# Patient Record
Sex: Female | Born: 1960 | Race: Black or African American | Hispanic: No | Marital: Married | Smoking: Never smoker
Health system: Southern US, Community
[De-identification: ages and names within clinical notes are randomized; demographics above are authoritative.]

## PROBLEM LIST (undated history)

## (undated) DIAGNOSIS — B019 Varicella without complication: Secondary | ICD-10-CM

## (undated) DIAGNOSIS — I499 Cardiac arrhythmia, unspecified: Secondary | ICD-10-CM

## (undated) DIAGNOSIS — M1990 Unspecified osteoarthritis, unspecified site: Secondary | ICD-10-CM

## (undated) DIAGNOSIS — K219 Gastro-esophageal reflux disease without esophagitis: Secondary | ICD-10-CM

## (undated) DIAGNOSIS — N390 Urinary tract infection, site not specified: Secondary | ICD-10-CM

## (undated) DIAGNOSIS — I38 Endocarditis, valve unspecified: Secondary | ICD-10-CM

## (undated) HISTORY — DX: Endocarditis, valve unspecified: I38

## (undated) HISTORY — DX: Gastro-esophageal reflux disease without esophagitis: K21.9

## (undated) HISTORY — DX: Cardiac arrhythmia, unspecified: I49.9

## (undated) HISTORY — DX: Varicella without complication: B01.9

## (undated) HISTORY — DX: Unspecified osteoarthritis, unspecified site: M19.90

## (undated) HISTORY — PX: ABDOMINAL HYSTERECTOMY: SHX81

## (undated) HISTORY — DX: Urinary tract infection, site not specified: N39.0

---

## 1998-03-22 ENCOUNTER — Ambulatory Visit (HOSPITAL_COMMUNITY): Admission: RE | Admit: 1998-03-22 | Discharge: 1998-03-22 | Payer: Self-pay | Admitting: Obstetrics

## 1998-04-12 ENCOUNTER — Encounter: Admission: RE | Admit: 1998-04-12 | Discharge: 1998-04-12 | Payer: Self-pay | Admitting: Obstetrics

## 1999-06-18 ENCOUNTER — Encounter: Payer: Self-pay | Admitting: Obstetrics

## 1999-06-18 ENCOUNTER — Ambulatory Visit (HOSPITAL_COMMUNITY): Admission: RE | Admit: 1999-06-18 | Discharge: 1999-06-18 | Payer: Self-pay | Admitting: Obstetrics

## 2002-06-01 ENCOUNTER — Ambulatory Visit (HOSPITAL_COMMUNITY): Admission: RE | Admit: 2002-06-01 | Discharge: 2002-06-01 | Payer: Self-pay | Admitting: *Deleted

## 2002-06-01 ENCOUNTER — Encounter: Payer: Self-pay | Admitting: *Deleted

## 2004-05-28 ENCOUNTER — Ambulatory Visit: Payer: Self-pay | Admitting: Obstetrics and Gynecology

## 2004-09-09 ENCOUNTER — Ambulatory Visit: Payer: Self-pay | Admitting: Obstetrics and Gynecology

## 2005-04-02 ENCOUNTER — Ambulatory Visit: Payer: Self-pay | Admitting: Unknown Physician Specialty

## 2005-08-20 ENCOUNTER — Ambulatory Visit: Payer: Self-pay | Admitting: Obstetrics and Gynecology

## 2005-09-02 ENCOUNTER — Ambulatory Visit: Payer: Self-pay | Admitting: Obstetrics and Gynecology

## 2006-03-03 ENCOUNTER — Emergency Department: Payer: Self-pay | Admitting: Emergency Medicine

## 2006-03-03 ENCOUNTER — Other Ambulatory Visit: Payer: Self-pay

## 2006-11-17 ENCOUNTER — Ambulatory Visit: Payer: Self-pay | Admitting: Obstetrics and Gynecology

## 2007-03-17 ENCOUNTER — Ambulatory Visit: Payer: Self-pay | Admitting: Gastroenterology

## 2007-11-30 ENCOUNTER — Ambulatory Visit: Payer: Self-pay | Admitting: Obstetrics and Gynecology

## 2008-12-13 ENCOUNTER — Ambulatory Visit: Payer: Self-pay | Admitting: Obstetrics and Gynecology

## 2009-01-20 ENCOUNTER — Emergency Department: Payer: Self-pay | Admitting: Emergency Medicine

## 2009-07-25 ENCOUNTER — Ambulatory Visit: Payer: Self-pay | Admitting: Gastroenterology

## 2009-12-17 ENCOUNTER — Ambulatory Visit: Payer: Self-pay | Admitting: Obstetrics and Gynecology

## 2010-05-06 ENCOUNTER — Emergency Department: Payer: Self-pay | Admitting: Emergency Medicine

## 2010-07-01 ENCOUNTER — Ambulatory Visit: Payer: Self-pay | Admitting: Gastroenterology

## 2010-07-04 ENCOUNTER — Ambulatory Visit: Payer: Self-pay | Admitting: Specialist

## 2010-07-26 ENCOUNTER — Ambulatory Visit (HOSPITAL_COMMUNITY)
Admission: RE | Admit: 2010-07-26 | Discharge: 2010-07-26 | Payer: Self-pay | Source: Home / Self Care | Attending: Rheumatology | Admitting: Rheumatology

## 2010-09-04 ENCOUNTER — Encounter: Payer: Self-pay | Admitting: Interventional Radiology

## 2010-10-09 ENCOUNTER — Ambulatory Visit: Payer: Self-pay | Admitting: Family Medicine

## 2011-01-15 ENCOUNTER — Ambulatory Visit: Payer: Self-pay | Admitting: Specialist

## 2012-01-09 ENCOUNTER — Ambulatory Visit: Payer: Self-pay | Admitting: Obstetrics and Gynecology

## 2012-02-18 DIAGNOSIS — N390 Urinary tract infection, site not specified: Secondary | ICD-10-CM

## 2012-02-18 HISTORY — DX: Urinary tract infection, site not specified: N39.0

## 2013-01-12 ENCOUNTER — Ambulatory Visit: Payer: Self-pay | Admitting: Obstetrics and Gynecology

## 2013-02-02 LAB — HM COLONOSCOPY: HM Colonoscopy: NORMAL

## 2013-02-23 ENCOUNTER — Ambulatory Visit: Payer: Self-pay | Admitting: Otolaryngology

## 2013-08-04 HISTORY — PX: ESOPHAGOGASTRODUODENOSCOPY ENDOSCOPY: SHX5814

## 2014-01-13 ENCOUNTER — Ambulatory Visit: Payer: Self-pay | Admitting: Obstetrics and Gynecology

## 2014-02-01 HISTORY — PX: COLONOSCOPY: SHX174

## 2014-02-20 ENCOUNTER — Ambulatory Visit: Payer: Self-pay | Admitting: Gastroenterology

## 2014-02-22 LAB — PATHOLOGY REPORT

## 2014-03-29 LAB — HM MAMMOGRAPHY: HM Mammogram: NORMAL

## 2014-03-29 LAB — HM PAP SMEAR: HM Pap smear: NORMAL

## 2014-07-06 ENCOUNTER — Encounter: Payer: Self-pay | Admitting: Internal Medicine

## 2014-07-06 ENCOUNTER — Encounter (INDEPENDENT_AMBULATORY_CARE_PROVIDER_SITE_OTHER): Payer: Self-pay

## 2014-07-06 ENCOUNTER — Ambulatory Visit (INDEPENDENT_AMBULATORY_CARE_PROVIDER_SITE_OTHER): Payer: 59 | Admitting: Internal Medicine

## 2014-07-06 VITALS — BP 102/54 | HR 83 | Temp 98.4°F | Ht 65.66 in | Wt 143.0 lb

## 2014-07-06 DIAGNOSIS — R0781 Pleurodynia: Secondary | ICD-10-CM | POA: Diagnosis not present

## 2014-07-06 DIAGNOSIS — M7521 Bicipital tendinitis, right shoulder: Secondary | ICD-10-CM | POA: Diagnosis not present

## 2014-07-06 DIAGNOSIS — K219 Gastro-esophageal reflux disease without esophagitis: Secondary | ICD-10-CM | POA: Insufficient documentation

## 2014-07-06 DIAGNOSIS — M1990 Unspecified osteoarthritis, unspecified site: Secondary | ICD-10-CM | POA: Insufficient documentation

## 2014-07-06 MED ORDER — PREDNISONE 10 MG PO TABS: ORAL_TABLET | ORAL | Status: DC

## 2014-07-06 MED ORDER — ESOMEPRAZOLE MAGNESIUM 40 MG PO PACK: 40.0000 mg | PACK | Freq: Two times a day (BID) | ORAL | Status: DC

## 2014-07-06 NOTE — Progress Notes (Signed)
Pre visit review using our clinic review tool, if applicable. No additional management support is needed unless otherwise documented below in the visit note. 

## 2014-07-06 NOTE — Assessment & Plan Note (Signed)
Will start Nexium 40 mg BID to see if this helps Diet information for GERD

## 2014-07-06 NOTE — Assessment & Plan Note (Signed)
Controlled on Advil

## 2014-07-06 NOTE — Progress Notes (Signed)
HPI  Pt presents to the clinic today to establish care. She is transferring care from Dr. Marvis MoellerMiles at [facility].  Flu:06/2013 Tetanus: 2008? LMP: Hysterectomy Pap Smear: 03/2014 Mammogram: 03/2014 Colon Screening: 2015 ( years) Vision Screening: yearly Dentist: bianually  Arthritis: in her right hip. She had tried plaquenil in the past but did not like it. She takes Advil now with good relief.  GERD: she is having reflux >3 times per week. Tums provides good relief. She has tried prilosec 40 mg daily but it did not help. She did have a recent UGI which did not show any evidence of erosion or esophagitis.  She does have some concerns today about right shoulder pain. Started about 2 months ago but worse recently. She describes it as a sharp stabbing pain. The pain does radiate down her arm. She denies numbness or tingling. 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 Advil has helped to ease the pain but it does not seem to go away.  She also c/o pain in her left lower rib cage. The pain will radiate around to her side. She reports it feels sharp and stabbing. It is not associated with breathing. She has not had a cough. She denies any injury to the area.  Past Medical History  Diagnosis Date  . Arthritis   . Chicken pox   . GERD (gastroesophageal reflux disease)   . Arrhythmia     Current Outpatient Prescriptions  Medication Sig Dispense Refill  . BIOTIN PO Take 1 capsule by mouth daily. 1000    . cholecalciferol (VITAMIN D) 400 UNITS TABS tablet Take 400 Units by mouth.    . Cranberry 500 MG CAPS Take by mouth.    . Evening Primrose Oil 500 MG CAPS Take by mouth.    . Multiple Vitamin (MULTIVITAMIN) tablet Take 1 tablet by mouth daily.     No current facility-administered medications for this visit.    Allergies  Allergen Reactions  . Hydroxychloroquine Sulfate Other (See Comments) and Swelling    body aches/pain  . Penicillins  Rash    Family History  Problem Relation Age of Onset  . Arthritis Mother   . Cancer Mother     colon  . Heart disease Mother   . Hypertension Mother   . Kidney disease Mother   . Diabetes Mother   . Cancer Father     prostate  . Heart disease Father   . Mental illness Father   . Leukemia Father   . Heart disease Brother   . Hypertension Brother   . Diabetes Brother   . Cancer Maternal Aunt     colon  . Hypertension Maternal Aunt   . Kidney disease Maternal Aunt   . Diabetes Maternal Aunt   . Heart disease Maternal Uncle   . Hypertension Maternal Uncle   . Kidney disease Maternal Uncle   . Diabetes Maternal Uncle   . Cancer Maternal Uncle     lung  . Cancer Paternal Aunt     bone  . Cancer Paternal Uncle   . Heart disease Paternal Uncle   . Arthritis Paternal Grandmother   . Heart disease Paternal Grandmother   . Heart disease Paternal Grandfather   . Stroke Paternal Grandfather     History   Social History  . Marital Status: Married    Spouse Name: N/A    Number of Children: N/A  . Years of  Education: N/A   Occupational History  . Not on file.   Social History Main Topics  . Smoking status: Never Smoker   . Smokeless tobacco: Never Used  . Alcohol Use: No  . Drug Use: Not on file  . Sexual Activity: Not on file   Other Topics Concern  . Not on file   Social History Narrative  . No narrative on file    ROS:  Constitutional: Denies fever, malaise, fatigue, headache or abrupt weight changes.  Respiratory: Denies difficulty breathing, shortness of breath, cough or sputum production.   Cardiovascular: Pt reports palpitations. Denies chest pain, chest tightness or swelling in the hands or feet.  Gastrointestinal: Pt reports reflux. Denies abdominal pain, bloating, constipation, diarrhea or blood in the stool.  Musculoskeletal: Pt reports right shoulder pain and pain in left ribs. Denies difficulty with gait, muscle pain or joint swelling.    No  other specific complaints in a complete review of systems (except as listed in HPI above).  PE:  BP 102/54 mmHg  Pulse 83  Temp(Src) 98.4 F (36.9 C) (Oral)  Ht 5' 5.66" (1.668 m)  Wt 143 lb (64.864 kg)  BMI 23.31 kg/m2  SpO2 98%  Wt Readings from Last 3 Encounters:  07/06/14 143 lb (64.864 kg)    General: Appears her stated age, well developed, well nourished in NAD. Cardiovascular: Normal rate and rhythm. S1,S2 noted.  No murmur, rubs or gallops noted.  Pulmonary/Chest: Normal effort and positive vesicular breath sounds. No respiratory distress. No wheezes, rales or ronchi noted.  Abdomen: Soft and tender in the epigastric region. Normal bowel sounds, no bruits noted. No distention or masses noted. Liver, spleen and kidneys non palpable. Musculoskeletal: Decreased internal rotation of the right shoulder. Normal external rotation. Pain with palpation over the biceps tendon. Strength 5/5 BUE. Pain with palpation over the anterior/lateral ribs just under the breast. No swelling noted.   Assessment and Plan:  Left shoulder pain secondary to biceps tendonitis:  eRx for pred taper If no improvement will consider xray of left shoulder  Left side rib pain:  ? Costochondritis or intercostal nerve irritation Will see if this resolves with the prednisone, If not , consider chest xray  RTC as needed or if pain persist or worsen

## 2014-07-06 NOTE — Patient Instructions (Signed)
Gastroesophageal Reflux Disease, Adult Gastroesophageal reflux disease (GERD) happens when acid from your stomach flows up into the esophagus. When acid comes in contact with the esophagus, the acid causes soreness (inflammation) in the esophagus. Over time, GERD may create small holes (ulcers) in the lining of the esophagus. CAUSES   Increased body weight. This puts pressure on the stomach, making acid rise from the stomach into the esophagus.  Smoking. This increases acid production in the stomach.  Drinking alcohol. This causes decreased pressure in the lower esophageal sphincter (valve or ring of muscle between the esophagus and stomach), allowing acid from the stomach into the esophagus.  Late evening meals and a full stomach. This increases pressure and acid production in the stomach.  A malformed lower esophageal sphincter. Sometimes, no cause is found. SYMPTOMS   Burning pain in the lower part of the mid-chest behind the breastbone and in the mid-stomach area. This may occur twice a week or more often.  Trouble swallowing.  Sore throat.  Dry cough.  Asthma-like symptoms including chest tightness, shortness of breath, or wheezing. DIAGNOSIS  Your caregiver may be able to diagnose GERD based on your symptoms. In some cases, X-rays and other tests may be done to check for complications or to check the condition of your stomach and esophagus. TREATMENT  Your caregiver may recommend over-the-counter or prescription medicines to help decrease acid production. Ask your caregiver before starting or adding any new medicines.  HOME CARE INSTRUCTIONS   Change the factors that you can control. Ask your caregiver for guidance concerning weight loss, quitting smoking, and alcohol consumption.  Avoid foods and drinks that make your symptoms worse, such as:  Caffeine or alcoholic drinks.  Chocolate.  Peppermint or mint flavorings.  Garlic and onions.  Spicy foods.  Citrus fruits,  such as oranges, lemons, or limes.  Tomato-based foods such as sauce, chili, salsa, and pizza.  Fried and fatty foods.  Avoid lying down for the 3 hours prior to your bedtime or prior to taking a nap.  Eat small, frequent meals instead of large meals.  Wear loose-fitting clothing. Do not wear anything tight around your waist that causes pressure on your stomach.  Raise the head of your bed 6 to 8 inches with wood blocks to help you sleep. Extra pillows will not help.  Only take over-the-counter or prescription medicines for pain, discomfort, or fever as directed by your caregiver.  Do not take aspirin, ibuprofen, or other nonsteroidal anti-inflammatory drugs (NSAIDs). SEEK IMMEDIATE MEDICAL CARE IF:   You have pain in your arms, neck, jaw, teeth, or back.  Your pain increases or changes in intensity or duration.  You develop nausea, vomiting, or sweating (diaphoresis).  You develop shortness of breath, or you faint.  Your vomit is green, yellow, black, or looks like coffee grounds or blood.  Your stool is red, bloody, or black. These symptoms could be signs of other problems, such as heart disease, gastric bleeding, or esophageal bleeding. MAKE SURE YOU:   Understand these instructions.  Will watch your condition.  Will get help right away if you are not doing well or get worse. Document Released: 04/30/2005 Document Revised: 10/13/2011 Document Reviewed: 02/07/2011 Exit[employer] Patient Information 2015 [employer]. This information is not intended to replace advice given to you by your health care provider. Make sure you discuss any questions you have with your health care provider.  

## 2015-01-09 ENCOUNTER — Emergency Department
Admission: EM | Admit: 2015-01-09 | Discharge: 2015-01-09 | Disposition: A | Discharge status: Home / Self Care | Payer: 59 | Attending: Emergency Medicine | Admitting: Emergency Medicine

## 2015-01-09 ENCOUNTER — Emergency Department: Payer: 59

## 2015-01-09 ENCOUNTER — Encounter: Payer: Self-pay | Admitting: Emergency Medicine

## 2015-01-09 ENCOUNTER — Other Ambulatory Visit: Payer: Self-pay

## 2015-01-09 DIAGNOSIS — Z7952 Long term (current) use of systemic steroids: Secondary | ICD-10-CM | POA: Diagnosis not present

## 2015-01-09 DIAGNOSIS — Z79899 Other long term (current) drug therapy: Secondary | ICD-10-CM | POA: Insufficient documentation

## 2015-01-09 DIAGNOSIS — R0789 Other chest pain: Secondary | ICD-10-CM | POA: Insufficient documentation

## 2015-01-09 DIAGNOSIS — Z880 Allergy status to penicillin: Secondary | ICD-10-CM | POA: Diagnosis not present

## 2015-01-09 DIAGNOSIS — R079 Chest pain, unspecified: Secondary | ICD-10-CM | POA: Diagnosis present

## 2015-01-09 LAB — CBC
HCT: 39.6 % (ref 35.0–47.0)
HEMOGLOBIN: 13 g/dL (ref 12.0–16.0)
MCH: 33 pg (ref 26.0–34.0)
MCHC: 32.8 g/dL (ref 32.0–36.0)
MCV: 100.5 fL — ABNORMAL HIGH (ref 80.0–100.0)
Platelets: 219 10*3/uL (ref 150–440)
RBC: 3.94 MIL/uL (ref 3.80–5.20)
RDW: 12.9 % (ref 11.5–14.5)
WBC: 5.8 10*3/uL (ref 3.6–11.0)

## 2015-01-09 LAB — BASIC METABOLIC PANEL
Anion gap: 7 (ref 5–15)
BUN: 21 mg/dL — AB (ref 6–20)
CHLORIDE: 104 mmol/L (ref 101–111)
CO2: 28 mmol/L (ref 22–32)
Calcium: 9.1 mg/dL (ref 8.9–10.3)
Creatinine, Ser: 1.04 mg/dL — ABNORMAL HIGH (ref 0.44–1.00)
GFR calc Af Amer: >60 mL/min (ref >60)
Glucose, Bld: 110 mg/dL — ABNORMAL HIGH (ref 65–99)
Potassium: 3.7 mmol/L (ref 3.5–5.1)
Sodium: 139 mmol/L (ref 135–145)

## 2015-01-09 LAB — TROPONIN I: Troponin I: <0.03 ng/mL (ref <0.031)

## 2015-01-09 MED ORDER — ASPIRIN 81 MG PO CHEW
CHEWABLE_TABLET | ORAL | Status: AC
  Administered 2015-01-09: 324 mg via ORAL
  Filled 2015-01-09: qty 4

## 2015-01-09 MED ORDER — ASPIRIN 81 MG PO CHEW
324.0000 mg | CHEWABLE_TABLET | Freq: Once | ORAL | Status: AC
  Administered 2015-01-09: 324 mg via ORAL

## 2015-01-09 NOTE — ED Notes (Signed)
Pt presents to ED with left sided chest pain that feels sharp in nature. Pt states she was driving into town about 2 weeks ago when she first noticed her pain and tonight pain increased. Denies sob, nausea, cough. Pain is not reproducible with palpation. Pt is alert with no increased work of breathing noted at this time. Skin warm and dry. No distress.

## 2015-01-09 NOTE — Discharge Instructions (Signed)

## 2015-01-09 NOTE — ED Provider Notes (Signed)
[facility] Emergency Department Provider Note  ____________________________________________  Time seen: 1:20 AM  I have reviewed the triage vital signs and the nursing notes.   HISTORY  Chief Complaint Chest Pain      HPI Cynthia Whitney is a 54 y.o. female presents with left inframammary sharp chest pain that is intermittent lasting approximately 1 minute each episode 2 weeks. Patient denies any shortness of breath no cough no leg pain or swelling.Patient is chest pain-free at present.     Past Medical History  Diagnosis Date  . Arthritis   . Chicken pox   . GERD (gastroesophageal reflux disease)   . Arrhythmia     Patient Active Problem List   Diagnosis Date Noted  . Arthritis 07/06/2014  . GERD (gastroesophageal reflux disease) 07/06/2014    Past Surgical History  Procedure Laterality Date  . Abdominal hysterectomy    . Cesarean section      2  . Colonoscopy  02/2014  . Esophagogastroduodenoscopy endoscopy  2015    Current Outpatient Rx  Name  Route  Sig  Dispense  Refill  . BIOTIN PO   Oral   Take 1 capsule by mouth daily. 1000         . cholecalciferol (VITAMIN D) 400 UNITS TABS tablet   Oral   Take 400 Units by mouth.         . Cranberry 500 MG CAPS   Oral   Take by mouth.         . esomeprazole (NEXIUM) 40 MG packet   Oral   Take 40 mg by mouth 2 (two) times daily.   60 each   2   . Evening Primrose Oil 500 MG CAPS   Oral   Take by mouth.         . Multiple Vitamin (MULTIVITAMIN) tablet   Oral   Take 1 tablet by mouth daily.         . predniSONE (DELTASONE) 10 MG tablet      Take 6 tabs day 1, 5 tabs day 2, 4 tabs day 3, 3 tabs day 4, 2 tabs day 5, 1 tab day 6   21 tablet   0     Allergies Hydroxychloroquine sulfate and Penicillins  Family History  Problem Relation Age of Onset  . Arthritis Mother   . Cancer Mother     colon  . Heart disease Mother   . Hypertension Mother   . Kidney  disease Mother   . Diabetes Mother   . Cancer Father     prostate  . Heart disease Father   . Mental illness Father   . Leukemia Father   . Heart disease Brother   . Hypertension Brother   . Diabetes Brother   . Cancer Maternal Aunt     colon  . Hypertension Maternal Aunt   . Kidney disease Maternal Aunt   . Diabetes Maternal Aunt   . Heart disease Maternal Uncle   . Hypertension Maternal Uncle   . Kidney disease Maternal Uncle   . Diabetes Maternal Uncle   . Cancer Maternal Uncle     lung  . Cancer Paternal Aunt     bone  . Cancer Paternal Uncle   . Heart disease Paternal Uncle   . Arthritis Paternal Grandmother   . Heart disease Paternal Grandmother   . Heart disease Paternal Grandfather   . Stroke Paternal Grandfather     Social History History  Substance  Use Topics  . Smoking status: Never Smoker   . Smokeless tobacco: Never Used  . Alcohol Use: No    Review of Systems  Constitutional: Negative for fever. Eyes: Negative for visual changes. ENT: Negative for sore throat. Cardiovascular: Positive for chest pain. Respiratory: Negative for shortness of breath. Gastrointestinal: Negative for abdominal pain, vomiting and diarrhea. Genitourinary: Negative for dysuria. Musculoskeletal: Negative for back pain. Skin: Negative for rash. Neurological: Negative for headaches, focal weakness or numbness.   10-point ROS otherwise negative.  ____________________________________________   PHYSICAL EXAM:  VITAL SIGNS: ED Triage Vitals  Enc Vitals Group     BP 01/09/15 0105 108/78 mmHg     Pulse Rate 01/09/15 0105 63     Resp 01/09/15 0105 18     Temp 01/09/15 0105 97.5 F (36.4 C)     Temp Source 01/09/15 0105 Oral     SpO2 01/09/15 0105 100 %     Weight 01/09/15 0105 133 lb (60.328 kg)     Height 01/09/15 0105  (1.676 m)     Head Cir --      Peak Flow --      Pain Score 01/09/15 0110 3     Pain Loc --      Pain Edu? --      Excl. in GC? --       Constitutional: Alert and oriented. Well appearing and in no distress. Eyes: Conjunctivae are normal. PERRL. Normal extraocular movements. ENT   Head: Normocephalic and atraumatic.   Nose: No congestion/rhinnorhea.   Mouth/Throat: Mucous membranes are moist.   Neck: No stridor. Cardiovascular: Normal rate, regular rhythm. Normal and symmetric distal pulses are present in all extremities. No murmurs, rubs, or gallops. Respiratory: Normal respiratory effort without tachypnea nor retractions. Breath sounds are clear and equal bilaterally. No wheezes/rales/rhonchi. Gastrointestinal: Soft and nontender. No distention. There is no CVA tenderness. Genitourinary: deferred Musculoskeletal: Nontender with normal range of motion in all extremities. No joint effusions.  No lower extremity tenderness nor edema. Neurologic:  Normal speech and language. No gross focal neurologic deficits are appreciated. Speech is normal.  Skin:  Skin is warm, dry and intact. No rash noted. Psychiatric: Mood and affect are normal. Speech and behavior are normal. Patient exhibits appropriate insight and judgment.  ____________________________________________    LABS (pertinent positives/negatives)  Labs Reviewed  CBC - Abnormal; Notable for the following:    MCV 100.5 (*)    All other components within normal limits  BASIC METABOLIC PANEL - Abnormal; Notable for the following:    Glucose, Bld 110 (*)    BUN 21 (*)    Creatinine, Ser 1.04 (*)    All other components within normal limits  TROPONIN I  TROPONIN I  TROPONIN I     ____________________________________________     EKG   Date: 01/09/2015  Rate: 62  Rhythm: normal sinus rhythm  QRS Axis: normal  Intervals: normal  ST/T Wave abnormalities: normal  Conduction Disutrbances: none  Narrative Interpretation: unremarkable       INITIAL IMPRESSION / ASSESSMENT AND PLAN / ED COURSE  Pertinent labs & imaging results that  were available during my care of the patient were reviewed by me and considered in my medical decision making (see chart for details).  History of physical exam consistent with chest pain of unclear etiology troponin negative 2 EKG unremarkable. Patient is chest pain-free at present.  ____________________________________________   FINAL CLINICAL IMPRESSION(S) / ED DIAGNOSES  Final diagnoses:  Other  chest pain         Darci Currentandolph N Brown, MD 01/10/15 2246

## 2015-01-22 ENCOUNTER — Other Ambulatory Visit: Payer: Self-pay | Admitting: Obstetrics and Gynecology

## 2015-01-22 DIAGNOSIS — Z1231 Encounter for screening mammogram for malignant neoplasm of breast: Secondary | ICD-10-CM

## 2015-01-24 ENCOUNTER — Ambulatory Visit
Admission: RE | Admit: 2015-01-24 | Discharge: 2015-01-24 | Disposition: A | Discharge status: Home / Self Care | Payer: 59 | Source: Ambulatory Visit | Attending: Obstetrics and Gynecology | Admitting: Obstetrics and Gynecology

## 2015-01-24 DIAGNOSIS — Z1231 Encounter for screening mammogram for malignant neoplasm of breast: Secondary | ICD-10-CM | POA: Diagnosis present

## 2015-08-10 ENCOUNTER — Other Ambulatory Visit: Payer: Self-pay | Admitting: Nurse Practitioner

## 2015-08-10 DIAGNOSIS — R1013 Epigastric pain: Secondary | ICD-10-CM

## 2015-08-10 DIAGNOSIS — R634 Abnormal weight loss: Secondary | ICD-10-CM

## 2015-08-20 ENCOUNTER — Ambulatory Visit
Admission: RE | Admit: 2015-08-20 | Discharge: 2015-08-20 | Disposition: A | Discharge status: Home / Self Care | Payer: Commercial Managed Care - HMO | Source: Ambulatory Visit | Attending: Nurse Practitioner | Admitting: Nurse Practitioner

## 2015-08-20 DIAGNOSIS — K5900 Constipation, unspecified: Secondary | ICD-10-CM | POA: Diagnosis not present

## 2015-08-20 DIAGNOSIS — R1013 Epigastric pain: Secondary | ICD-10-CM | POA: Diagnosis present

## 2015-08-20 DIAGNOSIS — R634 Abnormal weight loss: Secondary | ICD-10-CM | POA: Diagnosis present

## 2015-08-20 DIAGNOSIS — K2970 Gastritis, unspecified, without bleeding: Secondary | ICD-10-CM | POA: Diagnosis present

## 2015-08-20 MED ORDER — IOHEXOL 300 MG/ML  SOLN
100.0000 mL | Freq: Once | INTRAMUSCULAR | Status: AC | PRN
  Administered 2015-08-20: 100 mL via INTRAVENOUS

## 2015-11-12 ENCOUNTER — Encounter
Admission: RE | Disposition: A | Discharge status: Home / Self Care | Payer: Self-pay | Source: Ambulatory Visit | Attending: Gastroenterology

## 2015-11-12 ENCOUNTER — Encounter: Payer: Self-pay | Admitting: *Deleted

## 2015-11-12 ENCOUNTER — Ambulatory Visit: Payer: Commercial Managed Care - HMO | Admitting: Certified Registered"

## 2015-11-12 ENCOUNTER — Ambulatory Visit
Admission: RE | Admit: 2015-11-12 | Discharge: 2015-11-12 | Disposition: A | Discharge status: Home / Self Care | Payer: Commercial Managed Care - HMO | Source: Ambulatory Visit | Attending: Gastroenterology | Admitting: Gastroenterology

## 2015-11-12 DIAGNOSIS — Z79899 Other long term (current) drug therapy: Secondary | ICD-10-CM | POA: Insufficient documentation

## 2015-11-12 DIAGNOSIS — R634 Abnormal weight loss: Secondary | ICD-10-CM | POA: Insufficient documentation

## 2015-11-12 DIAGNOSIS — R1013 Epigastric pain: Secondary | ICD-10-CM | POA: Insufficient documentation

## 2015-11-12 DIAGNOSIS — K219 Gastro-esophageal reflux disease without esophagitis: Secondary | ICD-10-CM | POA: Insufficient documentation

## 2015-11-12 DIAGNOSIS — M1991 Primary osteoarthritis, unspecified site: Secondary | ICD-10-CM | POA: Insufficient documentation

## 2015-11-12 HISTORY — PX: ESOPHAGOGASTRODUODENOSCOPY (EGD) WITH PROPOFOL: SHX5813

## 2015-11-12 SURGERY — ESOPHAGOGASTRODUODENOSCOPY (EGD) WITH PROPOFOL
Anesthesia: General

## 2015-11-12 MED ORDER — SODIUM CHLORIDE 0.9 % IV SOLN
INTRAVENOUS | Status: DC
Start: 2015-11-12 — End: 2015-11-12
  Administered 2015-11-12: 1000 mL via INTRAVENOUS

## 2015-11-12 MED ORDER — SODIUM CHLORIDE 0.9 % IV SOLN: INTRAVENOUS | Status: DC

## 2015-11-12 MED ORDER — PROPOFOL 10 MG/ML IV BOLUS
INTRAVENOUS | Status: DC | PRN
  Administered 2015-11-12: 60 mg via INTRAVENOUS

## 2015-11-12 MED ORDER — LIDOCAINE HCL (PF) 2 % IJ SOLN
INTRAMUSCULAR | Status: DC | PRN
  Administered 2015-11-12: 60 mg via INTRADERMAL

## 2015-11-12 MED ORDER — PROPOFOL 500 MG/50ML IV EMUL
INTRAVENOUS | Status: DC | PRN
  Administered 2015-11-12: 150 ug/kg/min via INTRAVENOUS

## 2015-11-12 NOTE — Op Note (Signed)
[facility] Gastroenterology Patient Name: Cynthia Whitney Procedure Date: 11/12/2015 10:48 AM MRN: [id number] Account #: [id number] Date of Birth: Aug 09, 1960 Admit Type: Outpatient Age: 55 Room: [facility] ENDO ROOM 4 Gender: Female Note Status: Finalized Procedure:            Upper GI endoscopy Indications:          Epigastric abdominal pain, Suspected esophageal reflux,                        Abnormal CT of the GI tract, Weight loss Providers:            Ezzard Standing. Bluford Kaufmann, MD Referring MD:         Leanna Sato, MD (Referring MD) Medicines:            Monitored Anesthesia Care Complications:        No immediate complications. Procedure:            Pre-Anesthesia Assessment:                       - Prior to the procedure, a History and Physical was                        performed, and patient medications, allergies and                        sensitivities were reviewed. The patient's tolerance of                        previous anesthesia was reviewed.                       - The risks and benefits of the procedure and the                        sedation options and risks were discussed with the                        patient. All questions were answered and informed                        consent was obtained.                       - After reviewing the risks and benefits, the patient                        was deemed in satisfactory condition to undergo the                        procedure.                       After obtaining informed consent, the endoscope was                        passed under direct vision. Throughout the procedure,                        the patient's blood pressure, pulse, and oxygen  saturations were monitored continuously. The Endoscope                        was introduced through the mouth, and advanced to the                        second part of duodenum. The upper GI endoscopy was                        accomplished  without difficulty. The patient tolerated                        the procedure well. Findings:      The examined esophagus was normal.      The entire examined stomach was normal. Biopsies were taken with a cold       forceps for histology.      The examined duodenum was normal. Impression:           - Normal esophagus.                       - Normal stomach. Biopsied.                       - Normal examined duodenum. Recommendation:       - Discharge patient to home.                       - Observe patient's clinical course.                       - Continue present medications.                       - Await pathology results.                       - The findings and recommendations were discussed with                        the patient. Procedure Code(s):    --- Professional ---                       [phone], Esophagogastroduodenoscopy, flexible, transoral;                        with biopsy, single or multiple Diagnosis Code(s):    --- Professional ---                       R10.13, Epigastric pain                       R63.4, Abnormal weight loss                       R93.3, Abnormal findings on diagnostic imaging of other                        parts of digestive tract CPT copyright 2016 American [employer] Association. All rights reserved. The codes documented in this report are preliminary and upon coder review may  be revised to meet current compliance requirements. Wallace CullensPaul Y Taygen Newsome, MD 11/12/2015 10:57:58 AM  This report has been signed electronically. Number of Addenda: 0 Note Initiated On: 11/12/2015 10:48 AM      [facility]

## 2015-11-12 NOTE — H&P (Signed)
Primary Care Physician:  Leanna Sato, MD Primary Gastroenterologist:  Dr. Bluford Kaufmann  Pre-Procedure History & Physical: HPI:  Cynthia Whitney is a 55 y.o. female is here for an EGD.   Past Medical History  Diagnosis Date  . Arthritis   . Chicken pox   . GERD (gastroesophageal reflux disease)   . Arrhythmia     Past Surgical History  Procedure Laterality Date  . Abdominal hysterectomy    . Cesarean section      2  . Colonoscopy  02/2014  . Esophagogastroduodenoscopy endoscopy  2015    Prior to Admission medications   Medication Sig Start Date End Date Taking? Authorizing Provider  B Complex-C (B-COMPLEX WITH VITAMIN C) tablet Take 1 tablet by mouth daily.   Yes Historical Provider, MD  cholecalciferol (VITAMIN D) 1000 units tablet Take 1,000 Units by mouth daily.   Yes Historical Provider, MD  EVENING PRIMROSE OIL PO Take by mouth.   Yes Historical Provider, MD  esomeprazole (NEXIUM) 40 MG packet Take 40 mg by mouth 2 (two) times daily. Patient not taking: Reported on 01/09/2015 07/06/14   Lorre Munroe, NP    Allergies as of 11/09/2015 - Review Complete 08/20/2015  Allergen Reaction Noted  . Hydroxychloroquine sulfate Other (See Comments) 07/06/2014  . Penicillins Rash 07/06/2014    Family History  Problem Relation Age of Onset  . Arthritis Mother   . Cancer Mother     colon  . Heart disease Mother   . Hypertension Mother   . Kidney disease Mother   . Diabetes Mother   . Cancer Father     prostate  . Heart disease Father   . Mental illness Father   . Leukemia Father   . Heart disease Brother   . Hypertension Brother   . Diabetes Brother   . Cancer Maternal Aunt     colon  . Hypertension Maternal Aunt   . Kidney disease Maternal Aunt   . Diabetes Maternal Aunt   . Heart disease Maternal Uncle   . Hypertension Maternal Uncle   . Kidney disease Maternal Uncle   . Diabetes Maternal Uncle   . Cancer Maternal Uncle     lung  . Cancer Paternal Aunt     bone   . Cancer Paternal Uncle   . Heart disease Paternal Uncle   . Arthritis Paternal Grandmother   . Heart disease Paternal Grandmother   . Heart disease Paternal Grandfather   . Stroke Paternal Grandfather     Social History   Social History  . Marital Status: Married    Spouse Name: N/A  . Number of Children: N/A  . Years of Education: N/A   Occupational History  . Not on file.   Social History Main Topics  . Smoking status: Never Smoker   . Smokeless tobacco: Never Used  . Alcohol Use: No  . Drug Use: No  . Sexual Activity: Not Currently   Other Topics Concern  . Not on file   Social History Narrative    Review of Systems: See HPI, otherwise negative ROS  Physical Exam: BP 121/76 mmHg  Pulse 66  Temp(Src) 97.4 F (36.3 C) (Tympanic)  Resp 18  Ht  (1.676 m)  Wt 58.968 kg (130 lb)  BMI 20.99 kg/m2  SpO2 100% General:   Alert,  pleasant and cooperative in NAD Head:  Normocephalic and atraumatic. Neck:  Supple; no masses or thyromegaly. Lungs:  Clear throughout to auscultation.  Heart:  Regular rate and rhythm. Abdomen:  Soft, nontender and nondistended. Normal bowel sounds, without guarding, and without rebound.   Neurologic:  Alert and  oriented x4;  grossly normal neurologically.  Impression/Plan: Cynthia Whitney is here for an EGD to be performed for epigastric pain, GERD, abnormal x-ray Risks, benefits, limitations, and alternatives regarding  EGD have been reviewed with the patient.  Questions have been answered.  All parties agreeable.   Carolan Avedisian, Ezzard StandingPAUL Y, MD  11/12/2015, 10:42 AM

## 2015-11-12 NOTE — Anesthesia Postprocedure Evaluation (Signed)
Anesthesia Post Note  Patient: Cynthia Whitney  Procedure(s) Performed: Procedure(s): ESOPHAGOGASTRODUODENOSCOPY (EGD) WITH PROPOFOL  Patient location during evaluation: Endoscopy Anesthesia Type: General Level of consciousness: awake Pain management: pain level controlled Vital Signs Assessment: post-procedure vital signs reviewed and stable Respiratory status: spontaneous breathing Cardiovascular status: blood pressure returned to baseline Postop Assessment: no headache Anesthetic complications: no    Last Vitals:  Filed Vitals:   11/12/15 1059 11/12/15 1100  BP: 112/79   Pulse: 64 65  Temp: 36.2 C   Resp: 14 16    Last Pain: There were no vitals filed for this visit.               Oyinkansola Truax M

## 2015-11-12 NOTE — Anesthesia Preprocedure Evaluation (Signed)
Anesthesia Evaluation  Patient identified by MRN, date of birth, ID band Patient awake    Reviewed: Allergy & Precautions, NPO status , Patient's Chart, lab work & pertinent test results  Airway Mallampati: I  TM Distance: >3 FB Neck ROM: Full   Comment: No neck sx with her RA, god ROM. Dental  (+) Teeth Intact   Pulmonary    Pulmonary exam normal        Cardiovascular Exercise Tolerance: Good negative cardio ROS Normal cardiovascular exam     Neuro/Psych    GI/Hepatic GERD  Medicated and Controlled,  Endo/Other    Renal/GU      Musculoskeletal  (+) Arthritis , Rheumatoid disorders,  She has done well with her Ra, is active and has few sx.   Abdominal Normal abdominal exam  (+)   Peds  Hematology   Anesthesia Other Findings   Reproductive/Obstetrics                             Anesthesia Physical Anesthesia Plan  ASA: II  Anesthesia Plan: General   Post-op Pain Management:    Induction: Intravenous  Airway Management Planned: Nasal Cannula  Additional Equipment:   Intra-op Plan:   Post-operative Plan:   Informed Consent: I have reviewed the patients History and Physical, chart, labs and discussed the procedure including the risks, benefits and alternatives for the proposed anesthesia with the patient or authorized representative who has indicated his/her understanding and acceptance.     Plan Discussed with: CRNA  Anesthesia Plan Comments:         Anesthesia Quick Evaluation

## 2015-11-12 NOTE — Transfer of Care (Signed)
Immediate Anesthesia Transfer of Care Note  Patient: Cynthia Whitney  Procedure(s) Performed: Procedure(s): ESOPHAGOGASTRODUODENOSCOPY (EGD) WITH PROPOFOL  Patient Location: PACU  Anesthesia Type:General  Level of Consciousness: responds to stimulation, sleeping  Airway & Oxygen Therapy: Patient Spontanous Breathing and Patient connected to nasal cannula oxygen  Post-op Assessment: Report given to RN and Post -op Vital signs reviewed and stable  Post vital signs: Reviewed and stable  Last Vitals:  Filed Vitals:   11/12/15 1005 11/12/15 1059  BP: 121/76 112/79  Pulse: 66 64  Temp: 36.3 C   Resp: 18 14    Complications: No apparent anesthesia complications

## 2015-11-13 ENCOUNTER — Encounter: Payer: Self-pay | Admitting: Gastroenterology

## 2015-11-13 LAB — SURGICAL PATHOLOGY

## 2016-01-08 ENCOUNTER — Emergency Department: Payer: Commercial Managed Care - HMO

## 2016-01-08 ENCOUNTER — Encounter: Payer: Self-pay | Admitting: Emergency Medicine

## 2016-01-08 ENCOUNTER — Emergency Department
Admission: EM | Admit: 2016-01-08 | Discharge: 2016-01-08 | Disposition: A | Discharge status: Home / Self Care | Payer: Commercial Managed Care - HMO | Attending: Emergency Medicine | Admitting: Emergency Medicine

## 2016-01-08 DIAGNOSIS — N200 Calculus of kidney: Secondary | ICD-10-CM | POA: Insufficient documentation

## 2016-01-08 DIAGNOSIS — M1990 Unspecified osteoarthritis, unspecified site: Secondary | ICD-10-CM | POA: Insufficient documentation

## 2016-01-08 DIAGNOSIS — R319 Hematuria, unspecified: Secondary | ICD-10-CM | POA: Diagnosis present

## 2016-01-08 LAB — URINALYSIS COMPLETE WITH MICROSCOPIC (ARMC ONLY)
Bacteria, UA: NONE SEEN
Bilirubin Urine: NEGATIVE
Glucose, UA: NEGATIVE mg/dL
KETONES UR: NEGATIVE mg/dL
LEUKOCYTES UA: NEGATIVE
Nitrite: NEGATIVE
PH: 6 (ref 5.0–8.0)
PROTEIN: 30 mg/dL — AB
SQUAMOUS EPITHELIAL / LPF: NONE SEEN
Specific Gravity, Urine: 1.011 (ref 1.005–1.030)
WBC, UA: NONE SEEN WBC/hpf (ref 0–5)

## 2016-01-08 MED ORDER — OXYCODONE-ACETAMINOPHEN 5-325 MG PO TABS: 1.0000 | ORAL_TABLET | ORAL | Status: DC | PRN

## 2016-01-08 MED ORDER — PROMETHAZINE HCL 25 MG PO TABS: 25.0000 mg | ORAL_TABLET | Freq: Four times a day (QID) | ORAL | Status: DC | PRN

## 2016-01-08 NOTE — ED Provider Notes (Signed)
ED ECG REPORT I, Jene EveryKINNER, Nani Ingram, the attending physician, personally viewed and interpreted this ECG.  Date: 01/08/2016 EKG Time: 4:24 PM Rate: 68 Rhythm: normal sinus rhythm QRS Axis: normal Intervals: normal ST/T Wave abnormalities: normal Conduction Disturbances: none Narrative Interpretation: unremarkable   Jene Everyobert Angelic Schnelle, MD 01/08/16 [phone]

## 2016-01-08 NOTE — Discharge Instructions (Signed)
Kidney Stones °Kidney stones (urolithiasis) are deposits that form inside your kidneys. The intense pain is caused by the stone moving through the urinary tract. When the stone moves, the ureter goes into spasm around the stone. The stone is usually passed in the urine.  °CAUSES  °· A disorder that makes certain neck glands produce too much parathyroid hormone (primary hyperparathyroidism). °· A buildup of uric acid crystals, similar to gout in your joints. °· Narrowing (stricture) of the ureter. °· A kidney obstruction present at birth (congenital obstruction). °· Previous surgery on the kidney or ureters. °· Numerous kidney infections. °SYMPTOMS  °· Feeling sick to your stomach (nauseous). °· Throwing up (vomiting). °· Blood in the urine (hematuria). °· Pain that usually spreads (radiates) to the groin. °· Frequency or urgency of urination. °DIAGNOSIS  °· Taking a history and physical exam. °· Blood or urine tests. °· CT scan. °· Occasionally, an examination of the inside of the urinary bladder (cystoscopy) is performed. °TREATMENT  °· Observation. °· Increasing your fluid intake. °· Extracorporeal shock wave lithotripsy--This is a noninvasive procedure that uses shock waves to break up kidney stones. °· Surgery may be needed if you have severe pain or persistent obstruction. There are various surgical procedures. Most of the procedures are performed with the use of small instruments. Only small incisions are needed to accommodate these instruments, so recovery time is minimized. °The size, location, and chemical composition are all important variables that will determine the proper choice of action for you. Talk to your health care provider to better understand your situation so that you will minimize the risk of injury to yourself and your kidney.  °HOME CARE INSTRUCTIONS  °· Drink enough water and fluids to keep your urine clear or pale yellow. This will help you to pass the stone or stone fragments. °· Strain  all urine through the provided strainer. Keep all particulate matter and stones for your health care provider to see. The stone causing the pain may be as small as a grain of salt. It is very important to use the strainer each and every time you pass your urine. The collection of your stone will allow your health care provider to analyze it and verify that a stone has actually passed. The stone analysis will often identify what you can do to reduce the incidence of recurrences. °· Only take over-the-counter or prescription medicines for pain, discomfort, or fever as directed by your health care provider. °· Keep all follow-up visits as told by your health care provider. This is important. °· Get follow-up X-rays if required. The absence of pain does not always mean that the stone has passed. It may have only stopped moving. If the urine remains completely obstructed, it can cause loss of kidney function or even complete destruction of the kidney. It is your responsibility to make sure X-rays and follow-ups are completed. Ultrasounds of the kidney can show blockages and the status of the kidney. Ultrasounds are not associated with any radiation and can be performed easily in a matter of minutes. °· Make changes to your daily diet as told by your health care provider. You may be told to: °¨ Limit the amount of salt that you eat. °¨ Eat 5 or more servings of fruits and vegetables each day. °¨ Limit the amount of meat, poultry, fish, and eggs that you eat. °· Collect a 24-hour urine sample as told by your health care provider. You may need to collect another urine sample every 6-12   months. °SEEK MEDICAL CARE IF: °· You experience pain that is progressive and unresponsive to any pain medicine you have been prescribed. °SEEK IMMEDIATE MEDICAL CARE IF:  °· Pain cannot be controlled with the prescribed medicine. °· You have a fever or shaking chills. °· The severity or intensity of pain increases over 18 hours and is not  relieved by pain medicine. °· You develop a new onset of abdominal pain. °· You feel faint or pass out. °· You are unable to urinate. °  °This information is not intended to replace advice given to you by your health care provider. Make sure you discuss any questions you have with your health care provider. °  °Document Released: 07/21/2005 Document Revised: 04/11/2015 Document Reviewed: 12/22/2012 °Elsevier Interactive Patient Education ©2016 [employer] ° °

## 2016-01-08 NOTE — ED Provider Notes (Signed)
[facility] Emergency Department Provider Note  ____________________________________________  Time seen: Approximately 3:12 PM  I have reviewed the triage vital signs and the nursing notes.   HISTORY  Chief Complaint Hematuria    HPI Cynthia Whitney is a 55 y.o. female presents for evaluation of bloody urine today. Sudden onset this morning and denies any burning with urination denies frequency. Denies any pain. In addition, patient has been complaining of some chest pains with irregular heart rate. Denies any shortness of breath, denies any radiation of pain, denies any pressure.    Past Medical History  Diagnosis Date  . Arthritis   . Chicken pox   . GERD (gastroesophageal reflux disease)   . Arrhythmia     Patient Active Problem List   Diagnosis Date Noted  . Arthritis 07/06/2014  . GERD (gastroesophageal reflux disease) 07/06/2014    Past Surgical History  Procedure Laterality Date  . Abdominal hysterectomy    . Cesarean section      2  . Colonoscopy  02/2014  . Esophagogastroduodenoscopy endoscopy  2015  . Esophagogastroduodenoscopy (egd) with propofol  11/12/2015    Procedure: ESOPHAGOGASTRODUODENOSCOPY (EGD) WITH PROPOFOL;  Surgeon: Wallace Cullens, MD;  Location: [facility] ENDOSCOPY;  Service: Gastroenterology;;    Current Outpatient Rx  Name  Route  Sig  Dispense  Refill  . B Complex-C (B-COMPLEX WITH VITAMIN C) tablet   Oral   Take 1 tablet by mouth daily.         . cholecalciferol (VITAMIN D) 1000 units tablet   Oral   Take 1,000 Units by mouth daily.         Marland Kitchen EVENING PRIMROSE OIL PO   Oral   Take by mouth.         . oxyCODONE-acetaminophen (ROXICET) 5-325 MG tablet   Oral   Take 1-2 tablets by mouth every 4 (four) hours as needed for severe pain.   15 tablet   0   . promethazine (PHENERGAN) 25 MG tablet   Oral   Take 1 tablet (25 mg total) by mouth every 6 (six) hours as needed for nausea or vomiting.   10 tablet   0      Allergies Hydroxychloroquine sulfate and Penicillins  Family History  Problem Relation Age of Onset  . Arthritis Mother   . Cancer Mother     colon  . Heart disease Mother   . Hypertension Mother   . Kidney disease Mother   . Diabetes Mother   . Cancer Father     prostate  . Heart disease Father   . Mental illness Father   . Leukemia Father   . Heart disease Brother   . Hypertension Brother   . Diabetes Brother   . Cancer Maternal Aunt     colon  . Hypertension Maternal Aunt   . Kidney disease Maternal Aunt   . Diabetes Maternal Aunt   . Heart disease Maternal Uncle   . Hypertension Maternal Uncle   . Kidney disease Maternal Uncle   . Diabetes Maternal Uncle   . Cancer Maternal Uncle     lung  . Cancer Paternal Aunt     bone  . Cancer Paternal Uncle   . Heart disease Paternal Uncle   . Arthritis Paternal Grandmother   . Heart disease Paternal Grandmother   . Heart disease Paternal Grandfather   . Stroke Paternal Grandfather     Social History Social History  Substance Use Topics  . Smoking  status: Never Smoker   . Smokeless tobacco: Never Used  . Alcohol Use: No    Review of Systems Constitutional: No fever/chills Eyes: No visual changes. ENT: No sore throat. Cardiovascular: Positive for occasional chest pain. Respiratory: Denies shortness of breath. Gastrointestinal: No abdominal pain.  No nausea, no vomiting.  No diarrhea.  No constipation. Genitourinary: Negative for dysuria. Positive for hematuria. Musculoskeletal: Negative for back pain. Skin: Negative for rash. Neurological: Negative for headaches, focal weakness or numbness.  10-point ROS otherwise negative.  ____________________________________________   PHYSICAL EXAM:  VITAL SIGNS: ED Triage Vitals  Enc Vitals Group     BP 01/08/16 1432 116/73 mmHg     Pulse Rate 01/08/16 1432 84     Resp 01/08/16 1432 20     Temp 01/08/16 1432 98.5 F (36.9 C)     Temp Source 01/08/16 1432  Oral     SpO2 01/08/16 1432 99 %     Weight 01/08/16 1432 130 lb (58.968 kg)     Height 01/08/16 1432 5\' 6"  (1.676 m)     Head Cir --      Peak Flow --      Pain Score 01/08/16 1433 0     Pain Loc --      Pain Edu? --      Excl. in GC? --     Constitutional: Alert and oriented. Well appearing and in no acute distress. Head: Atraumatic. Nose: No congestion/rhinnorhea. Mouth/Throat: Mucous membranes are moist.  Oropharynx non-erythematous. Neck: No stridor.   Cardiovascular: Normal rate, regular rhythm. Grossly normal heart sounds.  Good peripheral circulation. Respiratory: Normal respiratory effort.  No retractions. Lungs CTAB. Gastrointestinal: Soft and nontender. No distention. No abdominal bruits. No CVA tenderness. Musculoskeletal: No lower extremity tenderness nor edema.  No joint effusions. Neurologic:  Normal speech and language. No gross focal neurologic deficits are appreciated. No gait instability. Skin:  Skin is warm, dry and intact. No rash noted. Psychiatric: Mood and affect are normal. Speech and behavior are normal.  ____________________________________________   LABS (all labs ordered are listed, but only abnormal results are displayed)  Labs Reviewed  URINALYSIS COMPLETEWITH MICROSCOPIC ([facility] ONLY) - Abnormal; Notable for the following:    Color, Urine YELLOW (*)    APPearance CLOUDY (*)    Hgb urine dipstick 3+ (*)    Protein, ur 30 (*)    All other components within normal limits   ____________________________________________  EKG   ____________________________________________  RADIOLOGY  IMPRESSION: Partially obstructing 4 mm proximal right ureteral stone causing mild hydroureter and right pelviectasis.  Small pericardial effusion with uncertain significance. Clinical correlation is recommended. ____________________________________________   PROCEDURES  Procedure(s) performed: None  Critical Care performed:  No  ____________________________________________   INITIAL IMPRESSION / ASSESSMENT AND PLAN / ED COURSE  Pertinent labs & imaging results that were available during my care of the patient were reviewed by me and considered in my medical decision making (see chart for details).  Acute renal calculi. Rx given for Percocet and Phenergan to use as needed. Referral given to urology on call. Patient follow-up with her as needed. ____________________________________________   FINAL CLINICAL IMPRESSION(S) / ED DIAGNOSES  Final diagnoses:  Renal calculi     This chart was dictated using voice recognition software/Dragon. Despite best efforts to proofread, errors can occur which can change the meaning. Any change was purely unintentional.   Evangeline Dakinharles M Legrande Hao, PA-C 01/08/16 1714  Jene Everyobert Kinner, MD 01/08/16 [phone]

## 2016-01-08 NOTE — ED Notes (Signed)
Pt to ed with c/o bloody urine today.  Pt denies burning with urination, denies frequency.  Denies all pain.

## 2016-01-17 ENCOUNTER — Other Ambulatory Visit: Payer: Self-pay | Admitting: Obstetrics and Gynecology

## 2016-01-17 DIAGNOSIS — Z1231 Encounter for screening mammogram for malignant neoplasm of breast: Secondary | ICD-10-CM

## 2016-01-21 ENCOUNTER — Other Ambulatory Visit: Payer: Self-pay

## 2016-01-21 ENCOUNTER — Encounter: Payer: Self-pay | Admitting: Urology

## 2016-01-21 ENCOUNTER — Ambulatory Visit (INDEPENDENT_AMBULATORY_CARE_PROVIDER_SITE_OTHER): Payer: Commercial Managed Care - HMO | Admitting: Urology

## 2016-01-21 VITALS — BP 115/72 | HR 73 | Ht 66.0 in | Wt 138.4 lb

## 2016-01-21 DIAGNOSIS — R310 Gross hematuria: Secondary | ICD-10-CM | POA: Diagnosis not present

## 2016-01-21 DIAGNOSIS — I38 Endocarditis, valve unspecified: Secondary | ICD-10-CM | POA: Insufficient documentation

## 2016-01-21 DIAGNOSIS — N200 Calculus of kidney: Secondary | ICD-10-CM | POA: Diagnosis not present

## 2016-01-21 HISTORY — DX: Endocarditis, valve unspecified: I38

## 2016-01-21 LAB — MICROSCOPIC EXAMINATION: BACTERIA UA: NONE SEEN

## 2016-01-21 LAB — URINALYSIS, COMPLETE
BILIRUBIN UA: NEGATIVE
GLUCOSE, UA: NEGATIVE
Ketones, UA: NEGATIVE
Nitrite, UA: NEGATIVE
PH UA: 6.5 (ref 5.0–7.5)
PROTEIN UA: NEGATIVE
SPEC GRAV UA: 1.02 (ref 1.005–1.030)
UUROB: 0.2 mg/dL (ref 0.2–1.0)

## 2016-01-21 NOTE — Progress Notes (Signed)
01/21/2016 4:05 PM   Cynthia Whitney Jan 08, 1961 [id number]  Referring provider: Leanna Sato, MD [address] [place], [place] [address]  Chief Complaint  Patient presents with  . New Patient (Initial Visit)    gross hematuria, Nephrolithoasis    HPI: The patient approximate 10 days ago once the emergency room with gross hematuria not associated with pain. She was found to have a partially obstructing 4 mm stone in the proximal right ureter. She does not have a stone history. I'm not convinced she gets a lot of urinary tract infections. She has minimal voiding dysfunction. She is continent.    She passed blood twice after the visit and none since  Modifying factors: There are no other modifying factors  Associated signs and symptoms: There are no other associated signs and symptoms Aggravating and relieving factors: There are no other aggravating or relieving factors Severity: Moderate Duration: Persistent     PMH: Past Medical History  Diagnosis Date  . Arthritis   . Chicken pox   . GERD (gastroesophageal reflux disease)   . Arrhythmia   . Heart valve disease 01/21/2016  . Frequent UTI 02/18/2012    Surgical History: Past Surgical History  Procedure Laterality Date  . Abdominal hysterectomy    . Cesarean section      2  . Colonoscopy  02/2014  . Esophagogastroduodenoscopy endoscopy  2015  . Esophagogastroduodenoscopy (egd) with propofol  11/12/2015    Procedure: ESOPHAGOGASTRODUODENOSCOPY (EGD) WITH PROPOFOL;  Surgeon: Wallace Cullens, MD;  Location: [facility] ENDOSCOPY;  Service: Gastroenterology;;    Home Medications:    Medication List       This list is accurate as of: 01/21/16  4:05 PM.  Always use your most recent med list.               B-complex with vitamin C tablet  Take 1 tablet by mouth daily.     Biotin 10 MG Caps  Take 1 capsule by mouth.     cholecalciferol 1000 units tablet  Commonly known as:  VITAMIN D  Take 1,000 Units by mouth daily.       EVENING PRIMROSE OIL PO  Take by mouth.     multivitamin tablet  Take 1 tablet by mouth daily.     omeprazole 40 MG capsule  Commonly known as:  PRILOSEC  Take by mouth. Reported on 01/21/2016     oxyCODONE-acetaminophen 5-325 MG tablet  Commonly known as:  ROXICET  Take 1-2 tablets by mouth every 4 (four) hours as needed for severe pain.     Probiotic 250 MG Caps  Take by mouth.     promethazine 25 MG tablet  Commonly known as:  PHENERGAN  Take 1 tablet (25 mg total) by mouth every 6 (six) hours as needed for nausea or vomiting.        Allergies:  Allergies  Allergen Reactions  . Hydroxychloroquine Other (See Comments) and Swelling    body aches/pain  . Hydroxychloroquine Sulfate Other (See Comments)    body aches/pain  . Penicillins Rash    Family History: Family History  Problem Relation Age of Onset  . Arthritis Mother   . Cancer Mother     colon  . Heart disease Mother   . Hypertension Mother   . Kidney disease Mother   . Diabetes Mother   . Cancer Father     prostate  . Heart disease Father   . Mental illness Father   .  Leukemia Father   . Heart disease Brother   . Hypertension Brother   . Diabetes Brother   . Cancer Maternal Aunt     colon  . Hypertension Maternal Aunt   . Kidney disease Maternal Aunt   . Diabetes Maternal Aunt   . Heart disease Maternal Uncle   . Hypertension Maternal Uncle   . Kidney disease Maternal Uncle   . Diabetes Maternal Uncle   . Cancer Maternal Uncle     lung  . Cancer Paternal Aunt     bone  . Cancer Paternal Uncle   . Heart disease Paternal Uncle   . Arthritis Paternal Grandmother   . Heart disease Paternal Grandmother   . Heart disease Paternal Grandfather   . Stroke Paternal Grandfather     Social History:  reports that she has never smoked. She has never used smokeless tobacco. She reports that she does not drink alcohol or use illicit drugs.  ROS: UROLOGY Frequent Urination?: Yes Hard to  postpone urination?: No Burning/pain with urination?: No Get up at night to urinate?: No Leakage of urine?: No Urine stream starts and stops?: No Trouble starting stream?: No Do you have to strain to urinate?: No Blood in urine?: Yes Urinary tract infection?: Yes Sexually transmitted disease?: No Injury to kidneys or bladder?: No Painful intercourse?: No Weak stream?: No Currently pregnant?: No Vaginal bleeding?: No Last menstrual period?: No  Gastrointestinal Nausea?: No Vomiting?: No Indigestion/heartburn?: Yes Diarrhea?: No Constipation?: No  Constitutional Fever: No Night sweats?: No Weight loss?: No Fatigue?: No  Skin Skin rash/lesions?: Yes Itching?: No  Eyes Blurred vision?: Yes Double vision?: No  Ears/Nose/Throat Sore throat?: No Sinus problems?: Yes  Hematologic/Lymphatic Swollen glands?: No Easy bruising?: No  Cardiovascular Leg swelling?: No Chest pain?: No  Respiratory Cough?: Yes Shortness of breath?: No  Endocrine Excessive thirst?: No  Musculoskeletal Back pain?: No Joint pain?: No  Neurological Headaches?: No Dizziness?: Yes  Psychologic Depression?: Yes Anxiety?: No  Physical Exam: BP 115/72 mmHg  Pulse 73  Ht  (1.676 m)  Wt 138 lb 6.4 oz (62.778 kg)  BMI 22.35 kg/m2  Constitutional:  Alert and oriented, No acute distress. HEENT: [place] AT, moist mucus membranes.  Trachea midline, no masses. Cardiovascular: No clubbing, cyanosis, or edema. Respiratory: Normal respiratory effort, no increased work of breathing. GI: Abdomen is soft, nontender, nondistended, no abdominal masses GU: No CVA tenderness.  Skin: No rashes, bruises or suspicious lesions. Lymph: No cervical or inguinal adenopathy. Neurologic: Grossly intact, no focal deficits, moving all 4 extremities. Psychiatric: Normal mood and affect.  Laboratory Data: Lab Results  Component Value Date   WBC 5.8 01/09/2015   HGB 13.0 01/09/2015   HCT 39.6  01/09/2015   MCV 100.5* 01/09/2015   PLT 219 01/09/2015    Lab Results  Component Value Date   CREATININE 1.04* 01/09/2015     Urinalysis    Component Value Date/Time   COLORURINE YELLOW* 01/08/2016 1435   APPEARANCEUR CLOUDY* 01/08/2016 1435   LABSPEC 1.011 01/08/2016 1435   PHURINE 6.0 01/08/2016 1435   GLUCOSEU NEGATIVE 01/08/2016 1435   HGBUR 3+* 01/08/2016 1435   BILIRUBINUR NEGATIVE 01/08/2016 1435   KETONESUR NEGATIVE 01/08/2016 1435   PROTEINUR 30* 01/08/2016 1435   NITRITE NEGATIVE 01/08/2016 1435   LEUKOCYTESUR NEGATIVE 01/08/2016 1435    Pertinent Imaging: As above  Assessment & Plan:  The patient had gross hematuria likely from a 4 mm stone in the upper right ureter. She had no microscopic  hematuria today. I thought was reasonable the patient come back approximate 2 weeks with a KUB and to reassess her with a urinalysis. I do not believe she needs cystoscopy at this stage  1. Gross hematuria 2. Nephrolithiasis - Urinalysis, Complete  2. Nephrolithiasis  - Urinalysis, Complete   No Follow-up on file.  Martina SinnerMACDIARMID,Aaronjames Kelsay A, MD  [facility] [address], [address] [place], [place] [address] [phone]

## 2016-02-06 ENCOUNTER — Ambulatory Visit
Admission: RE | Admit: 2016-02-06 | Discharge: 2016-02-06 | Disposition: A | Discharge status: Home / Self Care | Payer: Commercial Managed Care - HMO | Source: Ambulatory Visit | Attending: Obstetrics and Gynecology | Admitting: Obstetrics and Gynecology

## 2016-02-06 ENCOUNTER — Encounter: Payer: Self-pay | Admitting: Urology

## 2016-02-06 ENCOUNTER — Ambulatory Visit
Admission: RE | Admit: 2016-02-06 | Discharge: 2016-02-06 | Disposition: A | Discharge status: Home / Self Care | Payer: Commercial Managed Care - HMO | Source: Ambulatory Visit | Attending: Urology | Admitting: Urology

## 2016-02-06 ENCOUNTER — Other Ambulatory Visit: Payer: Self-pay | Admitting: Obstetrics and Gynecology

## 2016-02-06 ENCOUNTER — Ambulatory Visit (INDEPENDENT_AMBULATORY_CARE_PROVIDER_SITE_OTHER): Payer: Commercial Managed Care - HMO | Admitting: Urology

## 2016-02-06 VITALS — BP 123/74 | HR 76 | Ht 66.0 in | Wt 138.0 lb

## 2016-02-06 DIAGNOSIS — N200 Calculus of kidney: Secondary | ICD-10-CM

## 2016-02-06 DIAGNOSIS — Z1231 Encounter for screening mammogram for malignant neoplasm of breast: Secondary | ICD-10-CM | POA: Diagnosis not present

## 2016-02-06 DIAGNOSIS — R319 Hematuria, unspecified: Secondary | ICD-10-CM | POA: Diagnosis present

## 2016-02-06 DIAGNOSIS — R310 Gross hematuria: Secondary | ICD-10-CM | POA: Diagnosis not present

## 2016-02-06 LAB — URINALYSIS, COMPLETE
BILIRUBIN UA: NEGATIVE
GLUCOSE, UA: NEGATIVE
Ketones, UA: NEGATIVE
Nitrite, UA: NEGATIVE
PROTEIN UA: NEGATIVE
Specific Gravity, UA: 1.02 (ref 1.005–1.030)
UUROB: 0.2 mg/dL (ref 0.2–1.0)
pH, UA: 5.5 (ref 5.0–7.5)

## 2016-02-06 LAB — MICROSCOPIC EXAMINATION: BACTERIA UA: NONE SEEN

## 2016-02-06 NOTE — Progress Notes (Signed)
Patient initially presented with gross hematuria last month and was found to have a 4 mm right proximal ureteral stone. Since that time her urinalysis has been normal. UA today showed a few white cells, no bacteria, 0-2 red blood cells. About a week ago the patient had urgency and dysuria and was started on antibiotics and Pyridium. She was later called and told the culture was normal and to stop antibiotics. She did have some right flank pain yesterday. She has not seen a stone pass. She underwent a KUB today and I reviewed the images. There is a subtle pelvic phlebolith in the right pelvis but this was also seen on the CT scan. No obvious ureteral stones were noted. I also reviewed the CT images.  The patient is in no acute distress today.  Assessment/plan: Right proximal ureteral stone-her symptoms last week are suspicious for stone passage. Also KUB equivocal today. As she is concerned will check a renal ultrasound to ensure no hydronephrosis and she'll follow-up to review.

## 2016-02-26 ENCOUNTER — Ambulatory Visit
Admission: RE | Admit: 2016-02-26 | Discharge: 2016-02-26 | Disposition: A | Discharge status: Home / Self Care | Payer: Commercial Managed Care - HMO | Source: Ambulatory Visit | Attending: Urology | Admitting: Urology

## 2016-02-26 DIAGNOSIS — N200 Calculus of kidney: Secondary | ICD-10-CM

## 2016-02-26 DIAGNOSIS — R109 Unspecified abdominal pain: Secondary | ICD-10-CM | POA: Insufficient documentation

## 2016-03-03 ENCOUNTER — Ambulatory Visit (INDEPENDENT_AMBULATORY_CARE_PROVIDER_SITE_OTHER): Payer: Commercial Managed Care - HMO | Admitting: Urology

## 2016-03-03 VITALS — BP 114/79 | HR 69 | Ht 66.0 in | Wt 137.6 lb

## 2016-03-03 DIAGNOSIS — R310 Gross hematuria: Secondary | ICD-10-CM | POA: Diagnosis not present

## 2016-03-03 DIAGNOSIS — R3129 Other microscopic hematuria: Secondary | ICD-10-CM | POA: Diagnosis not present

## 2016-03-03 DIAGNOSIS — N201 Calculus of ureter: Secondary | ICD-10-CM

## 2016-03-03 MED ORDER — TAMSULOSIN HCL 0.4 MG PO CAPS: 0.4000 mg | ORAL_CAPSULE | Freq: Every day | ORAL | Status: DC

## 2016-03-03 MED ORDER — TAMSULOSIN HCL 0.4 MG PO CAPS: 0.4000 mg | ORAL_CAPSULE | Freq: Every day | ORAL | 0 refills | Status: DC

## 2016-03-03 MED ORDER — TRAMADOL HCL 50 MG PO TABS: 50.0000 mg | ORAL_TABLET | Freq: Four times a day (QID) | ORAL | 0 refills | Status: DC | PRN

## 2016-03-03 NOTE — Progress Notes (Signed)
Patient returns in continued management of kidney stones. She underwent a CT scan June 2017 which showed a 4 mm right proximal stone. A follow-up KUB was equivocal. Therefore to be certain there was no hydronephrosis she underwent a renal ultrasound July 27 which was normal. I reviewed the images. There was no hydronephrosis or stone noted. Unfortunately she continues to have bothersome symptoms which includes right flank pain, back pain, right groin pain. She has not seen a stone pass. She also has frequency urgency and dysuria. She is asking for pain meds due to significant discomfort.   A 13 system review of systems was obtained which positive for frequency, urgency, dysuria, nocturia, heartburn, diarrhea, night sweats, sinus problems, bruising, cough.  Physical exam: Patient is in no acute distress Neuro-no focal deficits Respiratory-regular effort and rate Extremities-no edema  UA today with 11-30 rbc's   A/P - ureteral stone, flank pain, microscopic hematuria and irritative voiding symptoms-patient has had follow-up negative KUB and ultrasound but she continues to have flank pain, groin pain, dysuria, frequency, urgency and microscopic hematuria. Urine was sent for culture. I discussed her stone may have passed. We discussed the nature risk and benefits of continued surveillance, repeat CT, follow-up for cystoscopy in the office to evaluate the bladder and the nature and quality of the ureteral orifice efflux or proceeding to the operating room cystoscopy, retrograde pyelograms and possible ureteroscopy. All questions answered. She elects to proceed with OR. Discussed she may need a staged procedure and may need a stent. Tramadol and tamsulosin sent.

## 2016-03-04 ENCOUNTER — Telehealth: Payer: Self-pay | Admitting: Radiology

## 2016-03-04 ENCOUNTER — Telehealth: Payer: Self-pay | Admitting: *Deleted

## 2016-03-04 LAB — MICROSCOPIC EXAMINATION: BACTERIA UA: NONE SEEN

## 2016-03-04 LAB — URINALYSIS, COMPLETE
BILIRUBIN UA: NEGATIVE
GLUCOSE, UA: NEGATIVE
Ketones, UA: NEGATIVE
Nitrite, UA: NEGATIVE
PROTEIN UA: NEGATIVE
SPEC GRAV UA: 1.025 (ref 1.005–1.030)
UUROB: 0.2 mg/dL (ref 0.2–1.0)
pH, UA: 5.5 (ref 5.0–7.5)

## 2016-03-04 NOTE — Telephone Encounter (Signed)
Pt states she wants to cancel surgery at this time & wait to see if the stone passes. Advised pt to return call if needed.

## 2016-03-04 NOTE — Telephone Encounter (Signed)
LMOM for patient to call office back. 

## 2016-03-04 NOTE — Telephone Encounter (Signed)
LMOM. Need to discuss surgery information. 

## 2016-03-04 NOTE — Telephone Encounter (Signed)
-----   Message from Jerilee Field, MD sent at 02/28/2016 10:38 AM EDT ----- Notify patient Renal U/S was normal. No kidney blockage seen.

## 2016-03-04 NOTE — Telephone Encounter (Signed)
Advised pt that per Dr Ronne Binning she needs to RTC in 2 weeks with KUB prior. Pt states she will call back to schedule later d/t husband's illness.

## 2016-03-05 LAB — CULTURE, URINE COMPREHENSIVE

## 2016-03-05 NOTE — Telephone Encounter (Signed)
Spoke with patient and gave her [place] results no questions from the patient   Cynthia Whitney

## 2016-03-06 ENCOUNTER — Other Ambulatory Visit

## 2016-03-17 ENCOUNTER — Encounter: Admission: RE | Payer: Self-pay | Source: Ambulatory Visit

## 2016-03-17 ENCOUNTER — Ambulatory Visit: Admission: RE | Admit: 2016-03-17 | Source: Ambulatory Visit | Admitting: Urology

## 2016-03-17 SURGERY — CYSTOSCOPY, WITH RETROGRADE PYELOGRAM
Anesthesia: Choice | Laterality: Bilateral

## 2016-08-15 ENCOUNTER — Telehealth: Payer: Self-pay | Admitting: Radiology

## 2016-08-15 NOTE — Telephone Encounter (Signed)
Contacted pt regarding f/u for ureteral stone. Pt had previously said she would call back to schedule but was lost to f/u. Pt states she has [facility] since for follow up.

## 2016-09-02 ENCOUNTER — Ambulatory Visit: Admitting: Internal Medicine

## 2016-09-03 ENCOUNTER — Ambulatory Visit (INDEPENDENT_AMBULATORY_CARE_PROVIDER_SITE_OTHER): Payer: 59 | Admitting: Internal Medicine

## 2016-09-03 ENCOUNTER — Encounter: Payer: Self-pay | Admitting: Internal Medicine

## 2016-09-03 VITALS — BP 118/76 | HR 80 | Temp 98.0°F | Wt 141.0 lb

## 2016-09-03 DIAGNOSIS — R0789 Other chest pain: Secondary | ICD-10-CM

## 2016-09-03 DIAGNOSIS — T148XXA Other injury of unspecified body region, initial encounter: Secondary | ICD-10-CM | POA: Diagnosis not present

## 2016-09-03 DIAGNOSIS — K1370 Unspecified lesions of oral mucosa: Secondary | ICD-10-CM

## 2016-09-03 MED ORDER — NAPROXEN 500 MG PO TABS: 500.0000 mg | ORAL_TABLET | Freq: Two times a day (BID) | ORAL | 2 refills | Status: DC

## 2016-09-03 NOTE — Patient Instructions (Signed)
Costochondritis Costochondritis is swelling and irritation (inflammation) of the tissue (cartilage) that connects your ribs to your breastbone (sternum). This causes pain in the front of your chest. Usually, the pain:  Starts gradually.  Is in more than one rib. This condition usually goes away on its own over time. Follow these instructions at home:  Do not do anything that makes your pain worse.  If directed, put ice on the painful area:  Put ice in a plastic bag.  Place a towel between your skin and the bag.  Leave the ice on for 20 minutes, 2-3 times a day.  If directed, put heat on the affected area as often as told by your doctor. Use the heat source that your doctor tells you to use, such as a moist heat pack or a heating pad.  Place a towel between your skin and the heat source.  Leave the heat on for 20-30 minutes.  Take off the heat if your skin turns bright red. This is very important if you cannot feel pain, heat, or cold. You may have a greater risk of getting burned.  Take over-the-counter and prescription medicines only as told by your doctor.  Return to your normal activities as told by your doctor. Ask your doctor what activities are safe for you.  Keep all follow-up visits as told by your doctor. This is important. Contact a doctor if:  You have chills or a fever.  Your pain does not go away or it gets worse.  You have a cough that does not go away. Get help right away if:  You are short of breath. This information is not intended to replace advice given to you by your health care provider. Make sure you discuss any questions you have with your health care provider. Document Released: 01/07/2008 Document Revised: 02/08/2016 Document Reviewed: 11/14/2015 Elsevier Interactive Patient Education  2017 [employer] Inc[employer]  

## 2016-09-03 NOTE — Progress Notes (Signed)
Subjective:    Patient ID: Cynthia Whitney, female    DOB: 12-Sep-1960, 56 y.o.   MRN: [id number]  HPI  Pt presents to the clinic today with c/o left side chest wall pain. This started a few months ago. She describes the pain as a constant ache, but can be sharp and stabbing with certain movements or lifting. The pain radiates around to underneath her shoulder blade. She denies numbness, tingling or rash in the area. She does report a dry cough that she developed over the last week. 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 She has not taken anything OTC for this.  She is also concerned about lesion in the back of her throat. She noticed this several months ago. The lesion are not painful. She does not have a sore throat. They have not changed over the last 2 months. She has not taken anything OTC for this.   Review of Systems      Past Medical History:  Diagnosis Date  . Arrhythmia   . Arthritis   . Chicken pox   . Frequent UTI 02/18/2012  . GERD (gastroesophageal reflux disease)   . Heart valve disease 01/21/2016    Current Outpatient Prescriptions  Medication Sig Dispense Refill  . B Complex-C (B-COMPLEX WITH VITAMIN C) tablet Take 1 tablet by mouth daily.    . Biotin 10 MG CAPS Take 1 capsule by mouth.    . cholecalciferol (VITAMIN D) 1000 units tablet Take 1,000 Units by mouth daily.    . Cranberry 500 MG CAPS Take 1 capsule by mouth.    . EVENING PRIMROSE OIL PO Take by mouth.    . Multiple Vitamin (MULTIVITAMIN) tablet Take 1 tablet by mouth daily.    . Saccharomyces boulardii (PROBIOTIC) 250 MG CAPS Take by mouth.    Marland Kitchen. omeprazole (PRILOSEC) 40 MG capsule Take by mouth. Reported on 01/21/2016     No current facility-administered medications for this visit.     Allergies  Allergen Reactions  . Hydroxychloroquine Other (See Comments) and Swelling    body aches/pain  . Penicillins Rash  . Hydroxychloroquine  Sulfate Other (See Comments)    body aches/pain    Family History  Problem Relation Age of Onset  . Arthritis Mother   . Cancer Mother     colon  . Heart disease Mother   . Hypertension Mother   . Kidney disease Mother   . Diabetes Mother   . Cancer Father     prostate  . Heart disease Father   . Mental illness Father   . Leukemia Father   . Heart disease Brother   . Hypertension Brother   . Diabetes Brother   . Cancer Maternal Aunt     colon  . Hypertension Maternal Aunt   . Kidney disease Maternal Aunt   . Diabetes Maternal Aunt   . Heart disease Maternal Uncle   . Hypertension Maternal Uncle   . Kidney disease Maternal Uncle   . Diabetes Maternal Uncle   . Cancer Maternal Uncle     lung  . Cancer Paternal Aunt     bone  . Cancer Paternal Uncle   . Heart disease Paternal Uncle   . Arthritis Paternal Grandmother   . Heart disease Paternal Grandmother   . Heart disease Paternal Grandfather   . Stroke Paternal Grandfather   . Breast cancer Neg Hx  Social History   Social History  . Marital status: Married    Spouse name: N/A  . Number of children: N/A  . Years of education: N/A   Occupational History  . Not on file.   Social History Main Topics  . Smoking status: Never Smoker  . Smokeless tobacco: Never Used  . Alcohol use No  . Drug use: No  . Sexual activity: Not Currently   Other Topics Concern  . Not on file   Social History Narrative  . No narrative on file     Constitutional: Denies fever, malaise, fatigue, headache or abrupt weight changes.  HEENT: Pt reports lesion in mouth. Denies eye pain, eye redness, ear pain, ringing in the ears, wax buildup, runny nose, nasal congestion, bloody nose, or sore throat. Respiratory: Denies difficulty breathing, shortness of breath, cough or sputum production.   Cardiovascular: Denies chest pain, chest tightness, palpitations or swelling in the hands or feet.  Musculoskeletal: Pt reports left chest  wall pain. Denies decrease in range of motion, difficulty with gait, or joint pain and swelling.    No other specific complaints in a complete review of systems (except as listed in HPI above).  Objective:   Physical Exam   BP 118/76   Pulse 80   Temp 98 F (36.7 C) (Oral)   Wt 141 lb (64 kg)   SpO2 98%   BMI 22.76 kg/m  Wt Readings from Last 3 Encounters:  09/03/16 141 lb (64 kg)  03/03/16 137 lb 9.6 oz (62.4 kg)  02/06/16 138 lb (62.6 kg)    General: Appears her stated age, well developed, well nourished in NAD. Skin: Warm, dry and intact. No rashes noted. HEENT: Throat/Mouth: Teeth present, mucosa pink and moist, no exudate, lesions or ulcerations noted.  Pulmonary/Chest: Normal effort and positive vesicular breath sounds.  Abdomen: Soft and nontender. No distention or masses noted.  Musculoskeletal: She has tenderness with palpation along the lower ribs of the left chest wall. No acute deformity identified. She has pain with rotation of the spine.   BMET    Component Value Date/Time   NA 139 01/09/2015 0117   K 3.7 01/09/2015 0117   CL 104 01/09/2015 0117   CO2 28 01/09/2015 0117   GLUCOSE 110 (H) 01/09/2015 0117   BUN 21 (H) 01/09/2015 0117   CREATININE 1.04 (H) 01/09/2015 0117   CALCIUM 9.1 01/09/2015 0117   GFRNONAA >60 01/09/2015 0117   GFRAA >60 01/09/2015 0117    Lipid Panel  No results found for: CHOL, TRIG, HDL, CHOLHDL, VLDL, LDLCALC  CBC    Component Value Date/Time   WBC 5.8 01/09/2015 0117   RBC 3.94 01/09/2015 0117   HGB 13.0 01/09/2015 0117   HCT 39.6 01/09/2015 0117   PLT 219 01/09/2015 0117   MCV 100.5 (H) 01/09/2015 0117   MCH 33.0 01/09/2015 0117   MCHC 32.8 01/09/2015 0117   RDW 12.9 01/09/2015 0117    Hgb A1C No results found for: HGBA1C         Assessment & Plan:   Left chest wall pain:  Likely muscle strain from lifting husband with ALS She has help with hospice aids, advised her to let them do most of the  lifting Can use a heating pad eRx for Naproxen 500 mg BID with food She declines muscle relaxer at this time  Lesions in mouth:  I do not see any abnormal lesions in her mouth Will monitor  Return precautions discussed Mallissa Lorenzen,  NP

## 2016-09-05 ENCOUNTER — Telehealth: Payer: Self-pay | Admitting: Internal Medicine

## 2016-09-05 NOTE — Telephone Encounter (Signed)
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

## 2016-09-05 NOTE — Telephone Encounter (Signed)
Was she offered an appt [facility]?

## 2016-09-05 NOTE — Telephone Encounter (Signed)
[facility] - Client TELEPHONE ADVICE RECORD [facility] Patient Name: Larena GlassmanDORIS Horlacher DOB: [birth year] Initial Comment Caller states she took first naproxen yesterday evening, now has red, bumpy rash size of 50 cent piece with white inside it. Nurse Assessment Nurse: Debera Latalston, RN, Tinnie GensJeffrey Date/Time Lamount Cohen(Eastern Time): 09/05/2016 12:56:35 PM Confirm and document reason for call. If symptomatic, describe symptoms. ---Caller states she took first naproxen yesterday evening, now has red, bumpy rash size of 50 cent piece with white inside it. Rash on waistline. Rash started this morning. Does the patient have any new or worsening symptoms? ---Yes Will a triage be completed? ---Yes Related visit to physician within the last 2 weeks? ---No Does the PT have any chronic conditions? (i.e. diabetes, asthma, etc.) ---No Is the patient pregnant or possibly pregnant? (Ask all females between the ages of 3912-55) ---No Is this a behavioral health or substance abuse call? ---No Guidelines Guideline Title Affirmed Question Affirmed Notes Rash or Redness - Localized Mild localized rash (all triage questions negative) Final Disposition User Home Care Debera Latalston, RN, Tinnie GensJeffrey Disagree/Comply: Comply

## 2016-09-05 NOTE — Telephone Encounter (Signed)
PLEASE NOTE: All timestamps contained within this report are represented as [place] Standard Time. CONFIDENTIALTY NOTICE: This fax transmission is intended only for the addressee. It contains information that is legally privileged, confidential or otherwise protected from use or disclosure. If you are not the intended recipient, you are strictly prohibited from reviewing, disclosing, copying using or disseminating any of this information or taking any action in reliance on or regarding this information. If you have received this fax in error, please notify us immediately by telephone so that we can arrange for its return to [place]. Phone: [phone], Toll-Free: [phone], Fax: [phone] Page: 1 of 2 Call Id: [id number] [facility] - Client TELEPHONE ADVICE RECORD [facility] Patient Name: Cynthia Whitney Gender: Female DOB: March 04, 1961 Age: 56 Y 11 M 22 D Return Phone Number: [phone] (Primary) City/State/Zip: [place] Client [facility] - Client Client Site [facility] - Day Physician Nicki Reaper - NP Who Is Calling Patient / Member / Family / Caregiver Call Type Triage / Clinical Relationship To Patient Self Return Phone Number [phone] (Primary) Chief Complaint Rash - Localized Reason for Call Symptomatic / Request for Health Information Initial Comment Caller states she took first naproxen yesterday evening, now has red, bumpy rash size of 50 cent piece with white inside it. Appointment Disposition EMR Appointment Not Necessary Info pasted into Epic Yes Nurse Assessment Nurse: Debera Lat, RN, Tinnie Gens Date/Time Lamount Cohen Time): 09/05/2016 12:56:35 PM Confirm and document reason for call. If symptomatic, describe symptoms. ---Caller states she took first naproxen yesterday evening, now has red, bumpy rash size of 50 cent piece with white inside it. Rash on waistline. Rash started this  morning. Does the PT have any chronic conditions? (i.e. diabetes, asthma, etc.) ---No Is the patient pregnant or possibly pregnant? (Ask all females between the ages of 48-55) ---No Guidelines Guideline Title Affirmed Question Rash or Redness - Localized Mild localized rash (all triage questions negative) Disp. Time Lamount Cohen Time) Disposition Final User 09/05/2016 12:59:37 PM Home Care Yes Debera Lat, RN, [facility] Advice Given Per Guideline HOME CARE: You should be able to treat this at home. REASSURANCE: New localized rashes are usually due to skin contact with an irritating substance. AVOID THE CAUSE: * Try to find the cause. * Consider irritants like a plant (e.g., poison ivy or evergreens), chemicals (e.g., solvents or insecticides), Fiberglass, a new cosmetic, or new jewelry (called contact dermatitis). * A pet may be the intermediary (e.g., with poison ivy or poison oak). WASH THE AREA: Wash the area once thoroughly with soap and water to remove any remaining irritants. Thereafter avoid soaps in this area. Cleanse the area if needed with warm water. LOCAL COLD: Apply ice or soak in cold water for 20 minutes every 3 or 4 hours to reduce itching or pain. HYDROCORTISONE CREAM: * For very itchy spots, apply hydrocortisone cream 4 times a day as needed. * Available OTC in [place] as 0.5% and 1% cream. * CAUTION: Do not use if suspected ringworm, impetigo, Jock Itch, or Athlete's Foot. AVOID SCRATCHING: Try not to scratch. Cut your fingernails short. EXPECTED COURSE: Most of these rashes pass in 2 to 3 days. CONTAGIOUSNESS: Adults with localized rashes do not need to miss any work or school. CALL BACK IF: * Rash spreads or becomes worse * Rash lasts over 1 week. * You become worse. CARE ADVICE given per Rash - Localized and Cause Unknown (Adult) guideline. PLEASE NOTE: All timestamps  contained within this report are represented as [place] Standard Time. CONFIDENTIALTY NOTICE: This fax  transmission is intended only for the addressee. It contains information that is legally privileged, confidential or otherwise protected from use or disclosure. If you are not the intended recipient, you are strictly prohibited from reviewing, disclosing, copying using or disseminating any of this information or taking any action in reliance on or regarding this information. If you have received this fax in error, please notify us immediately by telephone so that we can arrange for its return to us. Phone: [phone], Toll-Free: [phone], Fax: [phone] Page: 2 of 2 Call Id: [id number]

## 2016-09-06 ENCOUNTER — Ambulatory Visit (INDEPENDENT_AMBULATORY_CARE_PROVIDER_SITE_OTHER): Payer: 59 | Admitting: Family Medicine

## 2016-09-06 ENCOUNTER — Encounter: Payer: Self-pay | Admitting: Family Medicine

## 2016-09-06 VITALS — BP 118/70 | HR 84 | Temp 98.1°F | Resp 20 | Wt 142.8 lb

## 2016-09-06 DIAGNOSIS — Z636 Dependent relative needing care at home: Secondary | ICD-10-CM

## 2016-09-06 DIAGNOSIS — R21 Rash and other nonspecific skin eruption: Secondary | ICD-10-CM | POA: Diagnosis not present

## 2016-09-06 DIAGNOSIS — L989 Disorder of the skin and subcutaneous tissue, unspecified: Secondary | ICD-10-CM | POA: Insufficient documentation

## 2016-09-06 NOTE — Patient Instructions (Signed)
Possible shingles? Vs naprosyn reaction less likely. As improving and drying on its own, ok to continue supportive care.  Let us know if recurrent or new rash developing.

## 2016-09-06 NOTE — Assessment & Plan Note (Addendum)
New rash over last 2-3 days.  Not typical for drug rash. Regardless she will hold naprosyn for now.  Not typical for shingles although this is a possibility. Discussed antiviral and steroid cream. Pt declines at this time, will let us know if desires steroid cream for itching.  ?eczema type rash given other dry patches on wrist.  Update if recurrent or not improving.

## 2016-09-06 NOTE — Progress Notes (Addendum)
BP 118/70   Pulse 84   Temp 98.1 F (36.7 C) (Oral)   Resp 20   Wt 142 lb 12 oz (64.8 kg)   SpO2 98%   BMI 23.04 kg/m    CC: rash Subjective:    Patient ID: Cynthia Whitney, female    DOB: 10/28/1960, 56 y.o.   MRN: [id number]  HPI: Cynthia Whitney is a 56 y.o. female presenting on 09/06/2016 for Rash   3 h/o rash that started on same day she started naprosyn for costochondritis. Present on left lower flank. Itchy > tender. No paresthesias prior to rash. Some blisters initially. Self treated with baking soda. Some nausea with dry heaves. Some dry spots on wrists.   No fevers/chills, oral lesions., new joint pains   No new lotions/detergents/soaps/shampoos, no new foods. No other new medicines.   She has taken costochondritis in the past without rash.  H/o pityriasis rosea years ago.   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 Lots of stress.  No h/o shingles. Did have chicken pox growing up.   Relevant past medical, surgical, family and social history reviewed and updated as indicated. Interim medical history since our last visit reviewed. Allergies and medications reviewed and updated. Current Outpatient Prescriptions on File Prior to Visit  Medication Sig  . B Complex-C (B-COMPLEX WITH VITAMIN C) tablet Take 1 tablet by mouth daily.  . Biotin 10 MG CAPS Take 1 capsule by mouth.  . cholecalciferol (VITAMIN D) 1000 units tablet Take 1,000 Units by mouth daily.  . Cranberry 500 MG CAPS Take 1 capsule by mouth.  . EVENING PRIMROSE OIL PO Take by mouth.  . Multiple Vitamin (MULTIVITAMIN) tablet Take 1 tablet by mouth daily.  . naproxen (NAPROSYN) 500 MG tablet Take 1 tablet (500 mg total) by mouth 2 (two) times daily with a meal.  . Saccharomyces boulardii (PROBIOTIC) 250 MG CAPS Take by mouth.  Marland Kitchen omeprazole (PRILOSEC) 40 MG capsule Take by mouth. Reported on 01/21/2016  . [DISCONTINUED] esomeprazole (NEXIUM) 40 MG packet Take 40 mg by mouth 2 (two) times daily. (Patient not taking:  Reported on 01/09/2015)   No current facility-administered medications on file prior to visit.     Review of Systems Per HPI unless specifically indicated in ROS section     Objective:    BP 118/70   Pulse 84   Temp 98.1 F (36.7 C) (Oral)   Resp 20   Wt 142 lb 12 oz (64.8 kg)   SpO2 98%   BMI 23.04 kg/m   Wt Readings from Last 3 Encounters:  09/06/16 142 lb 12 oz (64.8 kg)  09/03/16 141 lb (64 kg)  03/03/16 137 lb 9.6 oz (62.4 kg)    Physical Exam  Constitutional: She appears well-developed and well-nourished. No distress.  HENT:  Mouth/Throat: Oropharynx is clear and moist. No oropharyngeal exudate.  No oral lesions  Cardiovascular: Normal rate, regular rhythm, normal heart sounds and intact distal pulses.   No murmur heard. Pulmonary/Chest: Effort normal and breath sounds normal. No respiratory distress. She has no wheezes. She has no rales.  Skin: Skin is warm and dry. Rash noted.  Patch of dry excoriation with scabbing left lower posterior flank with mild erythema surrounding Not truly vesicular today.  No other rash noted today other than some dry patches on extensor surfaces of wrists bilaterally.  Nursing note and vitals reviewed.     Assessment & Plan:   Problem List Items Addressed This Visit    Caregiver  stress   Skin rash - Primary    New rash over last 2-3 days.  Not typical for drug rash. Regardless she will hold naprosyn for now.  Not typical for shingles although this is a possibility. Discussed antiviral and steroid cream. Pt declines at this time, will let us know if desires steroid cream for itching.  ?eczema type rash given other dry patches on wrist.  Update if recurrent or not improving.           Follow up plan: Return if symptoms worsen or fail to improve.  Cynthia BoydenJavier Solace Manwarren, MD

## 2016-09-06 NOTE — Progress Notes (Signed)
Pre visit review using our clinic review tool, if applicable. No additional management support is needed unless otherwise documented below in the visit note. 

## 2017-01-20 ENCOUNTER — Other Ambulatory Visit: Payer: Self-pay | Admitting: Obstetrics and Gynecology

## 2017-01-20 DIAGNOSIS — Z1231 Encounter for screening mammogram for malignant neoplasm of breast: Secondary | ICD-10-CM

## 2017-02-05 ENCOUNTER — Ambulatory Visit: Payer: 59 | Admitting: Family Medicine

## 2017-02-09 ENCOUNTER — Ambulatory Visit
Admission: RE | Admit: 2017-02-09 | Discharge: 2017-02-09 | Disposition: A | Discharge status: Home / Self Care | Payer: 59 | Source: Ambulatory Visit | Attending: Obstetrics and Gynecology | Admitting: Obstetrics and Gynecology

## 2017-02-09 DIAGNOSIS — Z1231 Encounter for screening mammogram for malignant neoplasm of breast: Secondary | ICD-10-CM

## 2017-12-01 ENCOUNTER — Other Ambulatory Visit: Payer: Self-pay | Admitting: Obstetrics and Gynecology

## 2017-12-01 DIAGNOSIS — N644 Mastodynia: Secondary | ICD-10-CM

## 2017-12-03 ENCOUNTER — Other Ambulatory Visit: Payer: Self-pay | Admitting: Obstetrics and Gynecology

## 2017-12-03 DIAGNOSIS — N644 Mastodynia: Secondary | ICD-10-CM

## 2017-12-17 ENCOUNTER — Ambulatory Visit
Admission: RE | Admit: 2017-12-17 | Discharge: 2017-12-17 | Disposition: A | Discharge status: Home / Self Care | Payer: 59 | Source: Ambulatory Visit | Attending: Obstetrics and Gynecology | Admitting: Obstetrics and Gynecology

## 2017-12-17 DIAGNOSIS — N6452 Nipple discharge: Secondary | ICD-10-CM | POA: Diagnosis not present

## 2017-12-17 DIAGNOSIS — N644 Mastodynia: Secondary | ICD-10-CM | POA: Diagnosis present

## 2017-12-19 ENCOUNTER — Encounter: Payer: Self-pay | Admitting: Emergency Medicine

## 2017-12-19 ENCOUNTER — Emergency Department
Admission: EM | Admit: 2017-12-19 | Discharge: 2017-12-19 | Disposition: A | Discharge status: Home / Self Care | Payer: 59 | Attending: Emergency Medicine | Admitting: Emergency Medicine

## 2017-12-19 DIAGNOSIS — J0100 Acute maxillary sinusitis, unspecified: Secondary | ICD-10-CM | POA: Diagnosis not present

## 2017-12-19 DIAGNOSIS — Z79899 Other long term (current) drug therapy: Secondary | ICD-10-CM | POA: Diagnosis not present

## 2017-12-19 DIAGNOSIS — R0981 Nasal congestion: Secondary | ICD-10-CM | POA: Diagnosis present

## 2017-12-19 DIAGNOSIS — R509 Fever, unspecified: Secondary | ICD-10-CM | POA: Insufficient documentation

## 2017-12-19 MED ORDER — FLUTICASONE PROPIONATE 50 MCG/ACT NA SUSP: 2.0000 | Freq: Every day | NASAL | 0 refills | Status: DC

## 2017-12-19 MED ORDER — DOXYCYCLINE HYCLATE 50 MG PO CAPS: 100.0000 mg | ORAL_CAPSULE | Freq: Two times a day (BID) | ORAL | 0 refills | Status: AC

## 2017-12-19 NOTE — ED Provider Notes (Signed)
[facility] Emergency Department Provider Note  ____________________________________________  Time seen: Approximately 4:16 PM  I have reviewed the triage vital signs and the nursing notes.   HISTORY  Chief Complaint Nasal Congestion    HPI Cynthia Whitney is a 57 y.o. female presents emergency department for evaluation of fever, nasal congestion for 6 days.  Patient states that she had a fever on Monday and Tuesday of this week.  No fever today.  She was around a family member last week and that was also sick.  No sore throat, cough, shortness of breath, nausea, vomiting.  Past Medical History:  Diagnosis Date  . Arrhythmia   . Arthritis   . Chicken pox   . Frequent UTI 02/18/2012  . GERD (gastroesophageal reflux disease)   . Heart valve disease 01/21/2016    Patient Active Problem List   Diagnosis Date Noted  . Skin rash 09/06/2016  . Caregiver stress 09/06/2016  . Heart valve disease 01/21/2016  . Arthritis 07/06/2014  . GERD (gastroesophageal reflux disease) 07/06/2014  . Frequent UTI 02/18/2012    Past Surgical History:  Procedure Laterality Date  . ABDOMINAL HYSTERECTOMY    . CESAREAN SECTION     2  . COLONOSCOPY  02/2014  . ESOPHAGOGASTRODUODENOSCOPY (EGD) WITH PROPOFOL  11/12/2015   Procedure: ESOPHAGOGASTRODUODENOSCOPY (EGD) WITH PROPOFOL;  Surgeon: Wallace Cullens, MD;  Location: [facility] ENDOSCOPY;  Service: Gastroenterology;;  . ESOPHAGOGASTRODUODENOSCOPY ENDOSCOPY  2015    Prior to Admission medications   Medication Sig Start Date End Date Taking? Authorizing Provider  B Complex-C (B-COMPLEX WITH VITAMIN C) tablet Take 1 tablet by mouth daily.    [provider]  Biotin 10 MG CAPS Take 1 capsule by mouth.    [provider]  cholecalciferol (VITAMIN D) 1000 units tablet Take 1,000 Units by mouth daily.    [provider]  Cranberry 500 MG CAPS Take 1 capsule by mouth.    [provider]  doxycycline  (VIBRAMYCIN) 50 MG capsule Take 2 capsules (100 mg total) by mouth 2 (two) times daily for 10 days. 12/19/17 12/29/17  Enid Derry, PA-C  EVENING PRIMROSE OIL PO Take by mouth.    [provider]  fluticasone (FLONASE) 50 MCG/ACT nasal spray Place 2 sprays into both nostrils daily. 12/19/17 12/19/18  Enid Derry, PA-C  Multiple Vitamin (MULTIVITAMIN) tablet Take 1 tablet by mouth daily.    [provider]  naproxen (NAPROSYN) 500 MG tablet Take 1 tablet (500 mg total) by mouth 2 (two) times daily with a meal. 09/03/16   Baity, Salvadore Oxford, NP  omeprazole (PRILOSEC) 40 MG capsule Take by mouth. Reported on 01/21/2016 08/10/15 08/09/16  [provider]  Saccharomyces boulardii (PROBIOTIC) 250 MG CAPS Take by mouth.    [provider]    Allergies Hydroxychloroquine; Penicillins; and Hydroxychloroquine sulfate  Family History  Problem Relation Age of Onset  . Cancer Father        prostate  . Heart disease Father   . Mental illness Father   . Leukemia Father   . Arthritis Mother   . Cancer Mother        colon  . Heart disease Mother   . Hypertension Mother   . Kidney disease Mother   . Diabetes Mother   . Heart disease Brother   . Hypertension Brother   . Diabetes Brother   . Cancer Maternal Aunt        colon  . Hypertension Maternal Aunt   .  Kidney disease Maternal Aunt   . Diabetes Maternal Aunt   . Heart disease Maternal Uncle   . Hypertension Maternal Uncle   . Kidney disease Maternal Uncle   . Diabetes Maternal Uncle   . Cancer Maternal Uncle        lung  . Cancer Paternal Aunt        bone  . Cancer Paternal Uncle   . Heart disease Paternal Uncle   . Arthritis Paternal Grandmother   . Heart disease Paternal Grandmother   . Heart disease Paternal Grandfather   . Stroke Paternal Grandfather   . Breast cancer Neg Hx     Social History Social History   Tobacco Use  . Smoking status: Never Smoker  . Smokeless tobacco: Never Used   Substance Use Topics  . Alcohol use: No    Alcohol/week: 0.0 oz  . Drug use: No     Review of Systems  Eyes: No visual changes. No discharge. ENT: Positive for congestion and rhinorrhea. Cardiovascular: No chest pain. Respiratory: Negative for cough. No SOB. Gastrointestinal: No abdominal pain.  No nausea, no vomiting.  Musculoskeletal: Negative for musculoskeletal pain. Skin: Negative for rash, abrasions, lacerations, ecchymosis. Neurological: Negative for headaches.   ____________________________________________   PHYSICAL EXAM:  VITAL SIGNS: ED Triage Vitals  Enc Vitals Group     BP 12/19/17 1552 127/78     Pulse Rate 12/19/17 1552 99     Resp 12/19/17 1552 18     Temp 12/19/17 1552 98.5 F (36.9 C)     Temp Source 12/19/17 1552 Oral     SpO2 12/19/17 1552 98 %     Weight 12/19/17 1554 130 lb (59 kg)     Height 12/19/17 1554  (1.676 m)     Head Circumference --      Peak Flow --      Pain Score 12/19/17 1557 2     Pain Loc --      Pain Edu? --      Excl. in GC? --      Constitutional: Alert and oriented. Well appearing and in no acute distress. Eyes: Conjunctivae are normal. PERRL. EOMI. No discharge. Head: Atraumatic. ENT: No frontal and maxillary sinus tenderness.      Ears: Tympanic membranes pearly gray with good landmarks. No discharge.      Nose: Mild congestion/rhinnorhea.      Mouth/Throat: Mucous membranes are moist. Oropharynx non-erythematous. Tonsils not enlarged. No exudates. Uvula midline. Neck: No stridor.   Cardiovascular: Normal rate, regular rhythm.  Good peripheral circulation. Respiratory: Normal respiratory effort without tachypnea or retractions. Lungs CTAB. Good air entry to the bases with no decreased or absent breath sounds. Gastrointestinal: Bowel sounds 4 quadrants. Soft and nontender to palpation. No guarding or rigidity. No palpable masses. No distention. Musculoskeletal: Full range of motion to all extremities. No gross  deformities appreciated. Neurologic:  Normal speech and language. No gross focal neurologic deficits are appreciated.  Skin:  Skin is warm, dry and intact. No rash noted. Psychiatric: Mood and affect are normal. Speech and behavior are normal. Patient exhibits appropriate insight and judgement.   ____________________________________________   LABS (all labs ordered are listed, but only abnormal results are displayed)  Labs Reviewed - No data to display ____________________________________________  EKG   ____________________________________________  RADIOLOGY   No results found.  ____________________________________________    PROCEDURES  Procedure(s) performed:    Procedures    Medications - No data to display   ____________________________________________  INITIAL IMPRESSION / ASSESSMENT AND PLAN / ED COURSE  Pertinent labs & imaging results that were available during my care of the patient were reviewed by me and considered in my medical decision making (see chart for details).  Review of the [place] CSRS was performed in accordance of the [facility] prior to dispensing any controlled drugs.     Patient's diagnosis is consistent with sinusitis. Vital signs and exam are reassuring. Patient appears well and is staying well hydrated. Patient feels comfortable going home.  Patient is allergic to Augmentin.  Patient will be discharged home with prescriptions for doxycycline and flonase. Patient is to follow up with PCP as needed or otherwise directed. Patient is given ED precautions to return to the ED for any worsening or new symptoms.     ____________________________________________  FINAL CLINICAL IMPRESSION(S) / ED DIAGNOSES  Final diagnoses:  Acute non-recurrent maxillary sinusitis      NEW MEDICATIONS STARTED DURING THIS VISIT:  ED Discharge Orders        Ordered    doxycycline (VIBRAMYCIN) 50 MG capsule  2 times daily     12/19/17 1636    fluticasone  (FLONASE) 50 MCG/ACT nasal spray  Daily     12/19/17 1636          This chart was dictated using voice recognition software/Dragon. Despite best efforts to proofread, errors can occur which can change the meaning. Any change was purely unintentional.    Enid Derry, PA-C 12/19/17 1706    Dionne Bucy, MD 12/19/17 [phone]

## 2017-12-19 NOTE — ED Triage Notes (Signed)
Patient presents to the ED with nasal congestion and drainage since Monday with swollen lymph nodes since Thursday.  Patient denies cough and shortness of breath.  Patient reports cold chills and sweats the first couple of nights but not in the past few days.  Patient denies sore throat or difficulty swallowing.

## 2017-12-19 NOTE — ED Notes (Signed)

## 2017-12-25 IMAGING — CR DG ABDOMEN 1V
1 series · 2 of 2 positions shown · non-contrast
Comparison: CT 01/08/2016.

CLINICAL DATA: Hematuria.

EXAM:
ABDOMEN - 1 VIEW

[Series 1: dg abd 1 view · 0.14mm/px · 2 of 2 slices shown]
[im 1/2]
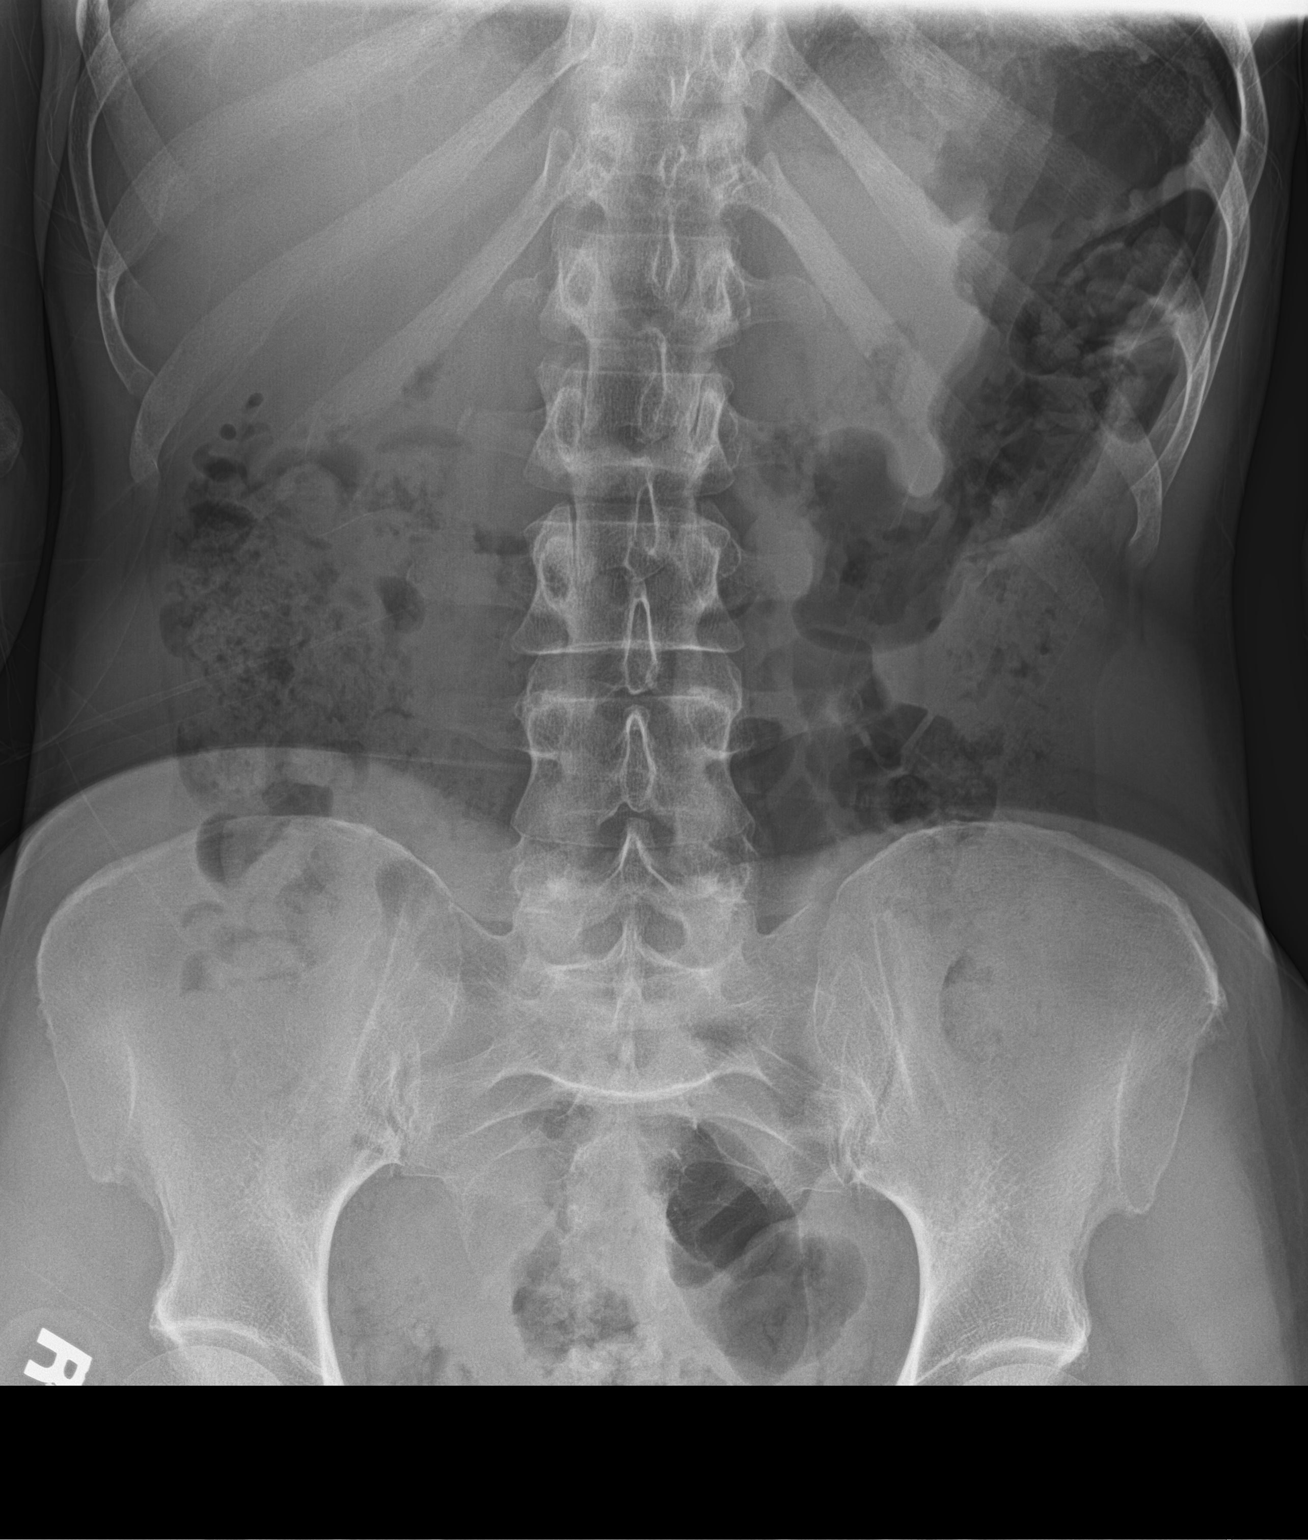
[im 2/2]
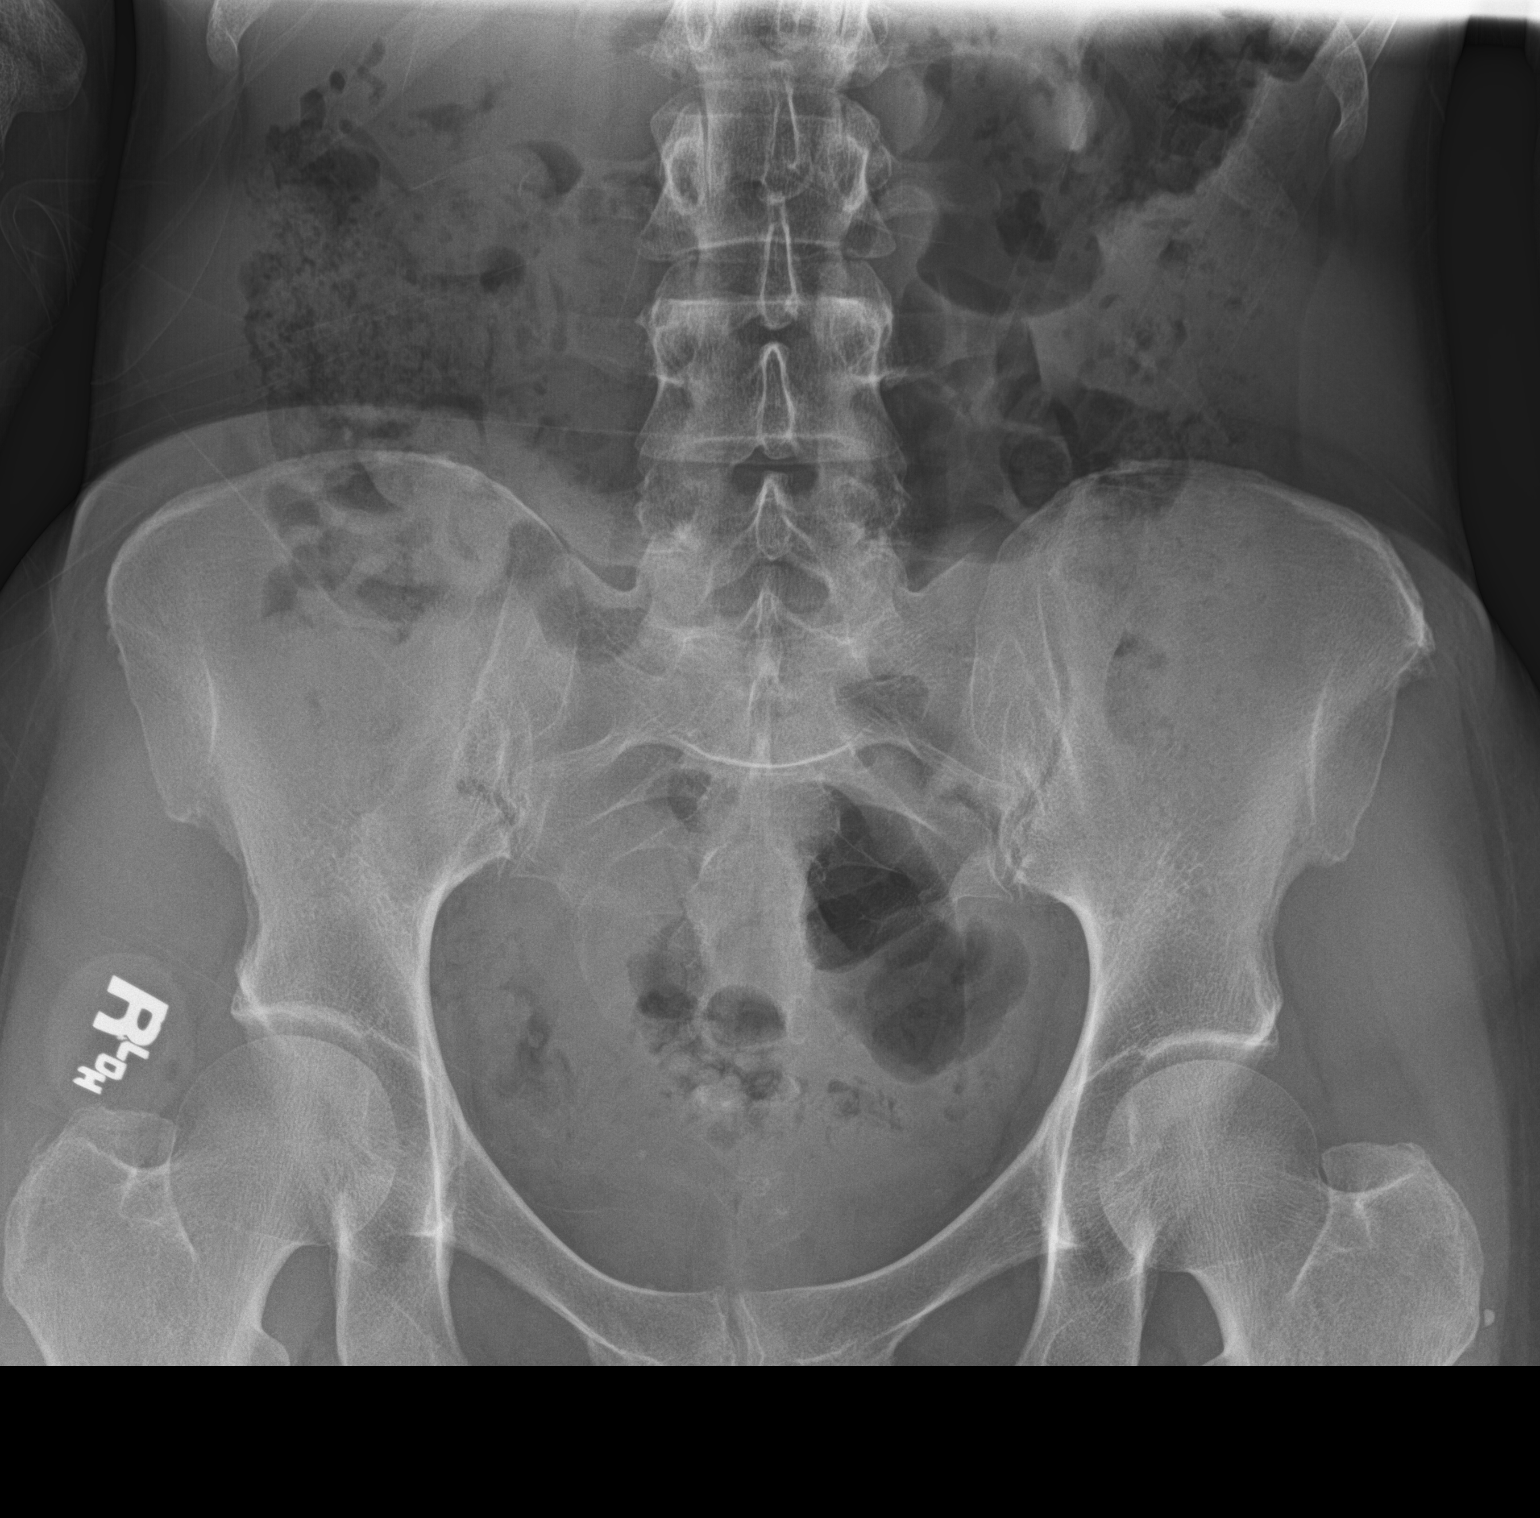

[2 of 2 positions shown; findings below may reference images not displayed]

FINDINGS: Soft tissue structures are unremarkable. No bowel distention. No
free air. Stool noted throughout the colon. Pelvic calcification
noted consistent with phleboliths. No acute bony abnormality
identified.
IMPRESSION: No acute abnormality .

## 2018-04-11 ENCOUNTER — Emergency Department
Admission: EM | Admit: 2018-04-11 | Discharge: 2018-04-11 | Disposition: A | Discharge status: Home / Self Care | Payer: 59 | Attending: Emergency Medicine | Admitting: Emergency Medicine

## 2018-04-11 ENCOUNTER — Encounter: Payer: Self-pay | Admitting: Medical Oncology

## 2018-04-11 ENCOUNTER — Other Ambulatory Visit: Payer: Self-pay

## 2018-04-11 DIAGNOSIS — Y92007 Garden or yard of unspecified non-institutional (private) residence as the place of occurrence of the external cause: Secondary | ICD-10-CM | POA: Diagnosis not present

## 2018-04-11 DIAGNOSIS — Z79899 Other long term (current) drug therapy: Secondary | ICD-10-CM | POA: Insufficient documentation

## 2018-04-11 DIAGNOSIS — Y93H2 Activity, gardening and landscaping: Secondary | ICD-10-CM | POA: Insufficient documentation

## 2018-04-11 DIAGNOSIS — W5911XA Bitten by nonvenomous snake, initial encounter: Secondary | ICD-10-CM | POA: Diagnosis not present

## 2018-04-11 DIAGNOSIS — Z23 Encounter for immunization: Secondary | ICD-10-CM | POA: Insufficient documentation

## 2018-04-11 DIAGNOSIS — Y999 Unspecified external cause status: Secondary | ICD-10-CM | POA: Diagnosis not present

## 2018-04-11 DIAGNOSIS — M79644 Pain in right finger(s): Secondary | ICD-10-CM | POA: Insufficient documentation

## 2018-04-11 MED ORDER — TETANUS-DIPHTH-ACELL PERTUSSIS 5-2.5-18.5 LF-MCG/0.5 IM SUSP
0.5000 mL | Freq: Once | INTRAMUSCULAR | Status: AC
  Administered 2018-04-11: 0.5 mL via INTRAMUSCULAR
  Filled 2018-04-11 (×2): qty 0.5

## 2018-04-11 NOTE — ED Provider Notes (Signed)
[facility] Emergency Department Provider Note   ____________________________________________    I have reviewed the triage vital signs and the nursing notes.   HISTORY  Chief Complaint Snake Bite     HPI Cynthia Whitney is a 57 y.o. female who presents after a snakebite.  Patient reports at approximately 2 PM today she was gardening with rubber gloves on and was bit by a small completely black snake on her right thumb.  She denies significant swelling.  She reports there is some mild itching at the area currently.  No other symptoms.  She is not sure when her last tetanus shot was   Past Medical History:  Diagnosis Date  . Arrhythmia   . Arthritis   . Chicken pox   . Frequent UTI 02/18/2012  . GERD (gastroesophageal reflux disease)   . Heart valve disease 01/21/2016    Patient Active Problem List   Diagnosis Date Noted  . Skin rash 09/06/2016  . Caregiver stress 09/06/2016  . Heart valve disease 01/21/2016  . Arthritis 07/06/2014  . GERD (gastroesophageal reflux disease) 07/06/2014  . Frequent UTI 02/18/2012    Past Surgical History:  Procedure Laterality Date  . ABDOMINAL HYSTERECTOMY    . CESAREAN SECTION     2  . COLONOSCOPY  02/2014  . ESOPHAGOGASTRODUODENOSCOPY (EGD) WITH PROPOFOL  11/12/2015   Procedure: ESOPHAGOGASTRODUODENOSCOPY (EGD) WITH PROPOFOL;  Surgeon: Wallace Cullens, MD;  Location: [facility] ENDOSCOPY;  Service: Gastroenterology;;  . ESOPHAGOGASTRODUODENOSCOPY ENDOSCOPY  2015    Prior to Admission medications   Medication Sig Start Date End Date Taking? Authorizing Provider  B Complex-C (B-COMPLEX WITH VITAMIN C) tablet Take 1 tablet by mouth daily.    [provider]  Biotin 10 MG CAPS Take 1 capsule by mouth.    [provider]  cholecalciferol (VITAMIN D) 1000 units tablet Take 1,000 Units by mouth daily.    [provider]  Cranberry 500 MG CAPS Take 1 capsule by mouth.    [provider]    EVENING PRIMROSE OIL PO Take by mouth.    [provider]  fluticasone (FLONASE) 50 MCG/ACT nasal spray Place 2 sprays into both nostrils daily. 12/19/17 12/19/18  Enid Derry, PA-C  Multiple Vitamin (MULTIVITAMIN) tablet Take 1 tablet by mouth daily.    [provider]  naproxen (NAPROSYN) 500 MG tablet Take 1 tablet (500 mg total) by mouth 2 (two) times daily with a meal. 09/03/16   Baity, Salvadore Oxford, NP  omeprazole (PRILOSEC) 40 MG capsule Take by mouth. Reported on 01/21/2016 08/10/15 08/09/16  [provider]  Saccharomyces boulardii (PROBIOTIC) 250 MG CAPS Take by mouth.    [provider]     Allergies Hydroxychloroquine; Penicillins; and Hydroxychloroquine sulfate  Family History  Problem Relation Age of Onset  . Cancer Father        prostate  . Heart disease Father   . Mental illness Father   . Leukemia Father   . Arthritis Mother   . Cancer Mother        colon  . Heart disease Mother   . Hypertension Mother   . Kidney disease Mother   . Diabetes Mother   . Heart disease Brother   . Hypertension Brother   . Diabetes Brother   . Cancer Maternal Aunt        colon  . Hypertension Maternal Aunt   . Kidney disease Maternal Aunt   . Diabetes Maternal Aunt   .  Heart disease Maternal Uncle   . Hypertension Maternal Uncle   . Kidney disease Maternal Uncle   . Diabetes Maternal Uncle   . Cancer Maternal Uncle        lung  . Cancer Paternal Aunt        bone  . Cancer Paternal Uncle   . Heart disease Paternal Uncle   . Arthritis Paternal Grandmother   . Heart disease Paternal Grandmother   . Heart disease Paternal Grandfather   . Stroke Paternal Grandfather   . Breast cancer Neg Hx     Social History Social History   Tobacco Use  . Smoking status: Never Smoker  . Smokeless tobacco: Never Used  Substance Use Topics  . Alcohol use: No    Alcohol/week: 0.0 standard drinks  . Drug use: No    Review of Systems  Constitutional: No  dizziness Eyes: No visual changes.  ENT: No throat swelling   Musculoskeletal: Negative for swelling Skin: As above Neurological: Negative for numbness   ____________________________________________   PHYSICAL EXAM:  VITAL SIGNS: ED Triage Vitals [04/11/18 1915]  Enc Vitals Group     BP 115/73     Pulse Rate 96     Resp 16     Temp 98.6 F (37 C)     Temp Source Oral     SpO2 99 %     Weight 59.9 kg (132 lb)     Height 1.676 m (5\' 6" )     Head Circumference      Peak Flow      Pain Score 1     Pain Loc      Pain Edu?      Excl. in GC?     Constitutional: Alert and oriented. No acute distress. Pleasant and interactive  Nose: No congestion/rhinnorhea.  Cardiovascular: Normal rate, regular rhythm. Good peripheral circulation. Respiratory: Normal respiratory effort.  No retractions.  Gastrointestinal: Soft and nontender. No distention.    Musculoskeletal: Right thumb examined in depth, no obvious puncture wounds, no swelling, no erythema, full range of motion without pain.  No motor exam Neurologic:  Normal speech and language. No gross focal neurologic deficits are appreciated.  Skin:  Skin is warm, dry and intact.  Psychiatric: Mood and affect are normal. Speech and behavior are normal.  ____________________________________________   LABS (all labs ordered are listed, but only abnormal results are displayed)  Labs Reviewed - No data to display ____________________________________________  EKG  None ____________________________________________  RADIOLOGY  None ____________________________________________   PROCEDURES  Procedure(s) performed: No  Procedures   Critical Care performed: No ____________________________________________   INITIAL IMPRESSION / ASSESSMENT AND PLAN / ED COURSE  Pertinent labs & imaging results that were available during my care of the patient were reviewed by me and considered in my medical decision making (see chart  for details).  Patient's exam is quite reassuring.  Bite occurred 6 hours ago, no swelling or erythema.  Description of snake is not consistent with pit viper, no indication for [employer]    ____________________________________________   FINAL CLINICAL IMPRESSION(S) / ED DIAGNOSES  Final diagnoses:  Snake bite, initial encounter        Note:  This document was prepared using Dragon voice recognition software and may include unintentional dictation errors.    Jene Every, MD 04/11/18 2010

## 2018-04-11 NOTE — ED Triage Notes (Signed)
Reports bitten on right thumb at approximately 2 pm today.  Reports area itches.  States snake was small and black in color.

## 2018-04-11 NOTE — ED Notes (Signed)
No swelling noted to area, pt reports she had a glove on when she was bit, around 2pm today.

## 2019-02-01 ENCOUNTER — Other Ambulatory Visit: Payer: Self-pay | Admitting: Obstetrics and Gynecology

## 2019-02-01 DIAGNOSIS — Z1231 Encounter for screening mammogram for malignant neoplasm of breast: Secondary | ICD-10-CM

## 2019-03-11 ENCOUNTER — Ambulatory Visit
Admission: RE | Admit: 2019-03-11 | Discharge: 2019-03-11 | Disposition: A | Discharge status: Home / Self Care | Payer: BC Managed Care – PPO | Source: Ambulatory Visit | Attending: Obstetrics and Gynecology | Admitting: Obstetrics and Gynecology

## 2019-03-11 DIAGNOSIS — Z1231 Encounter for screening mammogram for malignant neoplasm of breast: Secondary | ICD-10-CM | POA: Insufficient documentation

## 2019-10-06 DIAGNOSIS — L309 Dermatitis, unspecified: Secondary | ICD-10-CM | POA: Diagnosis not present

## 2019-10-10 DIAGNOSIS — Z79899 Other long term (current) drug therapy: Secondary | ICD-10-CM | POA: Diagnosis not present

## 2019-10-10 DIAGNOSIS — L931 Subacute cutaneous lupus erythematosus: Secondary | ICD-10-CM | POA: Diagnosis not present

## 2019-10-10 DIAGNOSIS — R21 Rash and other nonspecific skin eruption: Secondary | ICD-10-CM | POA: Diagnosis not present

## 2019-10-14 DIAGNOSIS — L931 Subacute cutaneous lupus erythematosus: Secondary | ICD-10-CM | POA: Diagnosis not present

## 2019-10-14 DIAGNOSIS — Z79899 Other long term (current) drug therapy: Secondary | ICD-10-CM | POA: Diagnosis not present

## 2019-11-21 DIAGNOSIS — Z79899 Other long term (current) drug therapy: Secondary | ICD-10-CM | POA: Diagnosis not present

## 2019-11-21 DIAGNOSIS — R7989 Other specified abnormal findings of blood chemistry: Secondary | ICD-10-CM | POA: Diagnosis not present

## 2019-11-21 DIAGNOSIS — L931 Subacute cutaneous lupus erythematosus: Secondary | ICD-10-CM | POA: Diagnosis not present

## 2019-11-30 DIAGNOSIS — R21 Rash and other nonspecific skin eruption: Secondary | ICD-10-CM | POA: Diagnosis not present

## 2020-02-02 ENCOUNTER — Other Ambulatory Visit: Payer: Self-pay | Admitting: Obstetrics and Gynecology

## 2020-02-02 DIAGNOSIS — Z79899 Other long term (current) drug therapy: Secondary | ICD-10-CM | POA: Diagnosis not present

## 2020-02-02 DIAGNOSIS — Z01419 Encounter for gynecological examination (general) (routine) without abnormal findings: Secondary | ICD-10-CM | POA: Diagnosis not present

## 2020-02-02 DIAGNOSIS — Z13220 Encounter for screening for lipoid disorders: Secondary | ICD-10-CM | POA: Diagnosis not present

## 2020-02-02 DIAGNOSIS — R7989 Other specified abnormal findings of blood chemistry: Secondary | ICD-10-CM | POA: Diagnosis not present

## 2020-02-02 DIAGNOSIS — Z1231 Encounter for screening mammogram for malignant neoplasm of breast: Secondary | ICD-10-CM

## 2020-02-02 DIAGNOSIS — Z1331 Encounter for screening for depression: Secondary | ICD-10-CM | POA: Diagnosis not present

## 2020-02-15 DIAGNOSIS — M545 Low back pain: Secondary | ICD-10-CM | POA: Diagnosis not present

## 2020-02-15 DIAGNOSIS — Z6822 Body mass index (BMI) 22.0-22.9, adult: Secondary | ICD-10-CM | POA: Diagnosis not present

## 2020-02-23 DIAGNOSIS — Z1211 Encounter for screening for malignant neoplasm of colon: Secondary | ICD-10-CM | POA: Diagnosis not present

## 2020-03-01 DIAGNOSIS — Z6821 Body mass index (BMI) 21.0-21.9, adult: Secondary | ICD-10-CM | POA: Diagnosis not present

## 2020-03-01 DIAGNOSIS — R7989 Other specified abnormal findings of blood chemistry: Secondary | ICD-10-CM | POA: Diagnosis not present

## 2020-03-12 ENCOUNTER — Ambulatory Visit
Admission: RE | Admit: 2020-03-12 | Discharge: 2020-03-12 | Disposition: A | Discharge status: Home / Self Care | Payer: BC Managed Care – PPO | Source: Ambulatory Visit | Attending: Obstetrics and Gynecology | Admitting: Obstetrics and Gynecology

## 2020-03-12 ENCOUNTER — Other Ambulatory Visit: Payer: Self-pay

## 2020-03-12 DIAGNOSIS — Z1231 Encounter for screening mammogram for malignant neoplasm of breast: Secondary | ICD-10-CM | POA: Insufficient documentation

## 2020-04-03 DIAGNOSIS — Z1389 Encounter for screening for other disorder: Secondary | ICD-10-CM | POA: Diagnosis not present

## 2020-04-03 DIAGNOSIS — Z87442 Personal history of urinary calculi: Secondary | ICD-10-CM | POA: Diagnosis not present

## 2020-04-03 DIAGNOSIS — M25562 Pain in left knee: Secondary | ICD-10-CM | POA: Diagnosis not present

## 2020-04-03 DIAGNOSIS — M5489 Other dorsalgia: Secondary | ICD-10-CM | POA: Diagnosis not present

## 2020-04-12 ENCOUNTER — Other Ambulatory Visit: Payer: Self-pay | Admitting: Family Medicine

## 2020-04-12 DIAGNOSIS — Z87442 Personal history of urinary calculi: Secondary | ICD-10-CM

## 2020-04-24 ENCOUNTER — Ambulatory Visit
Admission: RE | Admit: 2020-04-24 | Discharge: 2020-04-24 | Disposition: A | Discharge status: Home / Self Care | Payer: BC Managed Care – PPO | Source: Ambulatory Visit | Attending: Family Medicine | Admitting: Family Medicine

## 2020-04-24 ENCOUNTER — Other Ambulatory Visit: Payer: Self-pay

## 2020-04-24 DIAGNOSIS — I700 Atherosclerosis of aorta: Secondary | ICD-10-CM | POA: Diagnosis not present

## 2020-04-24 DIAGNOSIS — Z87442 Personal history of urinary calculi: Secondary | ICD-10-CM | POA: Diagnosis not present

## 2020-04-24 DIAGNOSIS — R109 Unspecified abdominal pain: Secondary | ICD-10-CM | POA: Diagnosis not present

## 2020-06-13 DIAGNOSIS — N200 Calculus of kidney: Secondary | ICD-10-CM | POA: Diagnosis not present

## 2020-06-19 DIAGNOSIS — R1013 Epigastric pain: Secondary | ICD-10-CM | POA: Diagnosis not present

## 2021-02-08 ENCOUNTER — Other Ambulatory Visit: Payer: Self-pay | Admitting: Obstetrics and Gynecology

## 2021-02-08 DIAGNOSIS — Z1331 Encounter for screening for depression: Secondary | ICD-10-CM | POA: Diagnosis not present

## 2021-02-08 DIAGNOSIS — F329 Major depressive disorder, single episode, unspecified: Secondary | ICD-10-CM | POA: Diagnosis not present

## 2021-02-08 DIAGNOSIS — Z01411 Encounter for gynecological examination (general) (routine) with abnormal findings: Secondary | ICD-10-CM | POA: Diagnosis not present

## 2021-02-08 DIAGNOSIS — Z1231 Encounter for screening mammogram for malignant neoplasm of breast: Secondary | ICD-10-CM

## 2021-03-13 ENCOUNTER — Ambulatory Visit
Admission: RE | Admit: 2021-03-13 | Discharge: 2021-03-13 | Disposition: A | Discharge status: Home / Self Care | Payer: BC Managed Care – PPO | Source: Ambulatory Visit | Attending: Obstetrics and Gynecology | Admitting: Obstetrics and Gynecology

## 2021-03-13 ENCOUNTER — Other Ambulatory Visit: Payer: Self-pay

## 2021-03-13 DIAGNOSIS — Z1231 Encounter for screening mammogram for malignant neoplasm of breast: Secondary | ICD-10-CM | POA: Diagnosis not present

## 2021-08-19 ENCOUNTER — Encounter: Payer: Self-pay | Admitting: Internal Medicine

## 2021-08-19 ENCOUNTER — Other Ambulatory Visit: Payer: Self-pay

## 2021-08-19 ENCOUNTER — Ambulatory Visit (INDEPENDENT_AMBULATORY_CARE_PROVIDER_SITE_OTHER): Payer: BC Managed Care – PPO | Admitting: Internal Medicine

## 2021-08-19 VITALS — BP 98/67 | HR 77 | Resp 17 | Ht 66.0 in | Wt 130.8 lb

## 2021-08-19 DIAGNOSIS — I519 Heart disease, unspecified: Secondary | ICD-10-CM | POA: Diagnosis not present

## 2021-08-19 DIAGNOSIS — Z833 Family history of diabetes mellitus: Secondary | ICD-10-CM | POA: Diagnosis not present

## 2021-08-19 DIAGNOSIS — Z23 Encounter for immunization: Secondary | ICD-10-CM

## 2021-08-19 DIAGNOSIS — G4700 Insomnia, unspecified: Secondary | ICD-10-CM | POA: Insufficient documentation

## 2021-08-19 DIAGNOSIS — I700 Atherosclerosis of aorta: Secondary | ICD-10-CM

## 2021-08-19 DIAGNOSIS — K219 Gastro-esophageal reflux disease without esophagitis: Secondary | ICD-10-CM

## 2021-08-19 DIAGNOSIS — L932 Other local lupus erythematosus: Secondary | ICD-10-CM

## 2021-08-19 DIAGNOSIS — M1990 Unspecified osteoarthritis, unspecified site: Secondary | ICD-10-CM | POA: Diagnosis not present

## 2021-08-19 DIAGNOSIS — Z636 Dependent relative needing care at home: Secondary | ICD-10-CM

## 2021-08-19 DIAGNOSIS — F5104 Psychophysiologic insomnia: Secondary | ICD-10-CM

## 2021-08-19 NOTE — Assessment & Plan Note (Signed)
She prefers natural supplements as opposed to prescription medications Will monitor

## 2021-08-19 NOTE — Patient Instructions (Signed)
Lupus Anticoagulant Panel Test Why am I having this test? The lupus anticoagulant panel test can be used to help find the cause of abnormal blood clotting. Your health care provider may recommend this test if: You are a woman and have had repeated miscarriages, preterm labor, or high blood pressure in pregnancy (preeclampsia). You have had previous blood tests showing a longer-than-normal blood clotting time or a lower-than-normal number of platelets (thrombocytopenia). Your health care provider suspects that you have a condition called antiphospholipid syndrome. You have systemic lupus erythematosus (SLE, or lupus) or your health care provider suspects that you have this condition. Your health care provider suspects that you have some type of rheumatic disease. What is being tested? This test checks your blood for certain autoantibodies (lupus anticoagulant autoantibodies) that can interfere with the normal blood clotting process. Autoantibodies are proteins that your body's defense system (immune system) makes to help fight invading germs, such as bacteria and viruses. Sometimes, the body mistakes normal tissues for abnormal ones, and it attacks those tissues as if they were invading germs. This is called an autoimmune response. The autoantibodies checked for in this test can mistakenly attack phospholipids, which are a normal part of many types of cells in the body. When blood clotting cells (platelets) are attacked by antiphospholipid antibodies, abnormal blood clotting can occur. When this happens, you are at an increased risk of developing repeated blood clots in your arteries and veins. You are also at an increased risk for heart attack or stroke. What kind of sample is taken? A blood sample is required for this test. It is usually collected by inserting a needle into a blood vessel or by sticking a finger with a small needle. Tell a health care provider about: All medicines you are taking,  including vitamins, herbs, eye drops, creams, and over-the-counter medicines. Any medical conditions you have. How are the results reported? Your test results will be reported as values that indicate whether lupus anticoagulant autoantibodies were found in your blood. Your health care provider will compare your results to normal values that were established after testing a large group of people (reference values). Reference values may vary among labs and hospitals. For this test, common normal reference values are: Less than 11 MPL (IgM phospholipid units). Less than 23 GPL (IgG phospholipid units). What do the results mean? Results that are higher than the reference values may indicate the following health conditions: Lupus. Antiphospholipid syndrome. Talk with your health care provider about what your results mean. Questions to ask your health care provider Ask your health care provider, or the department that is doing the test: When will my results be ready? How will I get my results? What are my treatment options? What other tests do I need? What are my next steps? Summary The lupus anticoagulant panel test can be used to help find the cause of abnormal blood clotting. This test checks your blood for certain proteins (autoantibodies) that can interfere with the normal blood clotting process. The autoantibodies checked for in this test can mistakenly attack phospholipids, which are a normal part of many types of cells in the body. If the autoantibodies attack blood clotting cells (platelets), you may develop frequent blood clots. Talk with your health care provider about what your test results mean. This information is not intended to replace advice given to you by your health care provider. Make sure you discuss any questions you have with your health care provider. Document Revised: 12/26/2019 Document Reviewed: 12/26/2019 Elsevier  Patient Education © 2022 [employer]vier Inc. ° °

## 2021-08-19 NOTE — Assessment & Plan Note (Signed)
She is not interested in medication management at this time Support offered

## 2021-08-19 NOTE — Assessment & Plan Note (Signed)
We will check CBC, ESR, CRP, ANA, rheumatoid factor, C3 and C4, antidouble-stranded DNA, antiphospholipid antibodies °

## 2021-08-19 NOTE — Assessment & Plan Note (Signed)
Avoid eating late at night Continue Omeprazole OTC

## 2021-08-19 NOTE — Assessment & Plan Note (Signed)
Continue Naproxen or Acetaminophen OTC as needed

## 2021-08-19 NOTE — Assessment & Plan Note (Signed)
C-Met and lipid profile today Consider statin therapy if LDL >100 Will discuss Aspirin use

## 2021-08-19 NOTE — Progress Notes (Signed)
HPI  Pt presents to the clinic today to establish care and for management of the conditions listed below.  Lupus: She reports intermittent rashes. This has been determined by biopsy.  She questions this diagnosis and would like a second opinion.  OA: Multiple joints.  She takes Naproxen or acetaminophen as needed with good relief of symptoms.  GERD: Triggered by eatng late at night. She takes Omeprazole as needed with good relief of symptoms.  Upper GI from 11/2015 reviewed.  Insomnia: She has difficulty falling asleep and staying asleep.  She is not currently taking any medications for this.  She does not snore, have apneic spells or nap during the day.  She does feel rested when she wakes up.  Caregiver Stress: She cares for her husband who has ALS.  She reports intermittent swings in mood but denies overt anxiety or depression at this time.  She is not currently seeing a therapist.  Past Medical History:  Diagnosis Date   Arrhythmia    Arthritis    Chicken pox    Frequent UTI 02/18/2012   GERD (gastroesophageal reflux disease)    Heart valve disease 01/21/2016    Current Outpatient Medications  Medication Sig Dispense Refill   B Complex-C (B-COMPLEX WITH VITAMIN C) tablet Take 1 tablet by mouth daily.     Biotin 10 MG CAPS Take 1 capsule by mouth.     cholecalciferol (VITAMIN D) 1000 units tablet Take 1,000 Units by mouth daily.     Cranberry 500 MG CAPS Take 1 capsule by mouth.     EVENING PRIMROSE OIL PO Take by mouth.     fluticasone (FLONASE) 50 MCG/ACT nasal spray Place 2 sprays into both nostrils daily. 16 g 0   Multiple Vitamin (MULTIVITAMIN) tablet Take 1 tablet by mouth daily.     naproxen (NAPROSYN) 500 MG tablet Take 1 tablet (500 mg total) by mouth 2 (two) times daily with a meal. 60 tablet 2   omeprazole (PRILOSEC) 40 MG capsule Take by mouth. Reported on 01/21/2016     Saccharomyces boulardii (PROBIOTIC) 250 MG CAPS Take by mouth.     No current facility-administered  medications for this visit.    Allergies  Allergen Reactions   Hydroxychloroquine Other (See Comments) and Swelling    body aches/pain   Penicillins Rash   Hydroxychloroquine Sulfate Other (See Comments)    body aches/pain    Family History  Problem Relation Age of Onset   Cancer Father        prostate   Heart disease Father    Mental illness Father    Leukemia Father    Arthritis Mother    Cancer Mother        colon   Heart disease Mother    Hypertension Mother    Kidney disease Mother    Diabetes Mother    Heart disease Brother    Hypertension Brother    Diabetes Brother    Cancer Maternal Aunt        colon   Hypertension Maternal Aunt    Kidney disease Maternal Aunt    Diabetes Maternal Aunt    Heart disease Maternal Uncle    Hypertension Maternal Uncle    Kidney disease Maternal Uncle    Diabetes Maternal Uncle    Cancer Maternal Uncle        lung   Cancer Paternal Aunt        bone   Cancer Paternal Uncle    Heart disease Paternal  Uncle    Arthritis Paternal Grandmother    Heart disease Paternal Grandmother    Heart disease Paternal Grandfather    Stroke Paternal Grandfather    Breast cancer Neg Hx     Social History   Socioeconomic History   Marital status: Married    Spouse name: Not on file   Number of children: Not on file   Years of education: Not on file   Highest education level: Not on file  Occupational History   Not on file  Tobacco Use   Smoking status: Never   Smokeless tobacco: Never  Substance and Sexual Activity   Alcohol use: No    Alcohol/week: 0.0 standard drinks   Drug use: No   Sexual activity: Not Currently  Other Topics Concern   Not on file  Social History Narrative   Not on file   Social Determinants of Health   Financial Resource Strain: Not on file  Food Insecurity: Not on file  Transportation Needs: Not on file  Physical Activity: Not on file  Stress: Not on file  Social Connections: Not on file  Intimate  Partner Violence: Not on file    ROS:  Constitutional: Denies fever, malaise, fatigue, headache or abrupt weight changes.  HEENT: Denies eye pain, eye redness, ear pain, ringing in the ears, wax buildup, runny nose, nasal congestion, bloody nose, or sore throat. Respiratory: Denies difficulty breathing, shortness of breath, cough or sputum production.   Cardiovascular: Denies chest pain, chest tightness, palpitations or swelling in the hands or feet.  Gastrointestinal: Patient reports intermittent reflux.  Denies abdominal pain, bloating, constipation, diarrhea or blood in the stool.  GU: Denies frequency, urgency, pain with urination, blood in urine, odor or discharge. Musculoskeletal: Patient reports intermittent joint pain.  Denies decrease in range of motion, difficulty with gait, muscle pain or joint swelling.  Skin: Patient reports rashes.  Denies redness, lesions or ulcercations.  Neurological: Patient reports insomnia.  Denies dizziness, difficulty with memory, difficulty with speech or problems with balance and coordination.  Psych: Patient reports caregiver stress.  Denies anxiety, depression, SI/HI.  No other specific complaints in a complete review of systems (except as listed in HPI above).  PE: BP 98/67 (BP Location: Right Arm, Patient Position: Sitting, Cuff Size: Normal)    Pulse 77    Resp 17    Ht 5\' 6"  (1.676 m)    Wt 130 lb 12.8 oz (59.3 kg)    SpO2 100%    BMI 21.11 kg/m   Wt Readings from Last 3 Encounters:  04/11/18 132 lb (59.9 kg)  12/19/17 130 lb (59 kg)  09/06/16 142 lb 12 oz (64.8 kg)    General: Appears her stated age, well developed, well nourished in NAD. HEENT: Head: normal shape and size; Eyes: EOMs intact;  Cardiovascular: Normal rate. Pulmonary/Chest: Normal effort. Musculoskeletal: No difficulty with gait.  Neurological: Alert and oriented.  Psychiatric: Mood and affect flat. Behavior is normal. Judgment and thought content normal.    BMET     Component Value Date/Time   NA 139 01/09/2015 0117   K 3.7 01/09/2015 0117   CL 104 01/09/2015 0117   CO2 28 01/09/2015 0117   GLUCOSE 110 (H) 01/09/2015 0117   BUN 21 (H) 01/09/2015 0117   CREATININE 1.04 (H) 01/09/2015 0117   CALCIUM 9.1 01/09/2015 0117   GFRNONAA >60 01/09/2015 0117   GFRAA >60 01/09/2015 0117    Lipid Panel  No results found for: CHOL, TRIG, HDL, CHOLHDL,  VLDL, LDLCALC  CBC    Component Value Date/Time   WBC 5.8 01/09/2015 0117   RBC 3.94 01/09/2015 0117   HGB 13.0 01/09/2015 0117   HCT 39.6 01/09/2015 0117   PLT 219 01/09/2015 0117   MCV 100.5 (H) 01/09/2015 0117   MCH 33.0 01/09/2015 0117   MCHC 32.8 01/09/2015 0117   RDW 12.9 01/09/2015 0117    Hgb A1C No results found for: HGBA1C   Assessment and Plan:    Webb Silversmith, NP This visit occurred during the SARS-CoV-2 public health emergency.  Safety protocols were in place, including screening questions prior to the visit, additional usage of staff PPE, and extensive cleaning of exam room while observing appropriate contact time as indicated for disinfecting solutions.

## 2021-08-23 LAB — ANTIPHOSPHOLIPID SYNDROME DIAGNOSTIC PANEL
Anticardiolipin IgA: <2 APL-U/mL (ref <20.0)
Anticardiolipin IgG: <2 GPL-U/mL (ref <20.0)
Anticardiolipin IgM: 10 MPL-U/mL (ref <20.0)
Beta-2 Glyco 1 IgA: <2 U/mL (ref <20.0)
Beta-2 Glyco 1 IgM: 9.9 U/mL (ref <20.0)
Beta-2 Glyco I IgG: <2 U/mL (ref <20.0)
PTT-LA Screen: 32 s (ref <=40)
dRVVT: 32 s (ref <=45)

## 2021-08-23 LAB — LIPID PANEL
Cholesterol: 197 mg/dL (ref <200)
HDL: 119 mg/dL (ref >=50)
LDL Cholesterol (Calc): 66 mg/dL (calc)
Non-HDL Cholesterol (Calc): 78 mg/dL (calc) (ref <130)
Total CHOL/HDL Ratio: 1.7 (calc) (ref <5.0)
Triglycerides: 50 mg/dL (ref <150)

## 2021-08-23 LAB — C-REACTIVE PROTEIN: CRP: 0.4 mg/L (ref <8.0)

## 2021-08-23 LAB — CBC
HCT: 43 % (ref 35.0–45.0)
Hemoglobin: 14.6 g/dL (ref 11.7–15.5)
MCH: 33.6 pg — ABNORMAL HIGH (ref 27.0–33.0)
MCHC: 34 g/dL (ref 32.0–36.0)
MCV: 99.1 fL (ref 80.0–100.0)
MPV: 10.3 fL (ref 7.5–12.5)
Platelets: 304 10*3/uL (ref 140–400)
RBC: 4.34 10*6/uL (ref 3.80–5.10)
RDW: 11.5 % (ref 11.0–15.0)
WBC: 4.6 10*3/uL (ref 3.8–10.8)

## 2021-08-23 LAB — HEMOGLOBIN A1C
Hgb A1c MFr Bld: 5.5 % of total Hgb (ref <5.7)
Mean Plasma Glucose: 111 mg/dL
eAG (mmol/L): 6.2 mmol/L

## 2021-08-23 LAB — COMPLETE METABOLIC PANEL WITH GFR
AG Ratio: 1.5 (calc) (ref 1.0–2.5)
ALT: 11 U/L (ref 6–29)
AST: 20 U/L (ref 10–35)
Albumin: 4.8 g/dL (ref 3.6–5.1)
Alkaline phosphatase (APISO): 92 U/L (ref 37–153)
BUN: 13 mg/dL (ref 7–25)
CO2: 26 mmol/L (ref 20–32)
Calcium: 9.8 mg/dL (ref 8.6–10.4)
Chloride: 102 mmol/L (ref 98–110)
Creat: 1.01 mg/dL (ref 0.50–1.03)
Globulin: 3.3 g/dL (calc) (ref 1.9–3.7)
Glucose, Bld: 83 mg/dL (ref 65–99)
Potassium: 4.3 mmol/L (ref 3.5–5.3)
Sodium: 138 mmol/L (ref 135–146)
Total Bilirubin: 0.6 mg/dL (ref 0.2–1.2)
Total Protein: 8.1 g/dL (ref 6.1–8.1)
eGFR: 64 mL/min/{1.73_m2} (ref >=60)

## 2021-08-23 LAB — PROTEIN / CREATININE RATIO, URINE
Creatinine, Urine: 103 mg/dL (ref 20–275)
Protein/Creat Ratio: 68 mg/g creat (ref 24–184)
Protein/Creatinine Ratio: 0.068 mg/mg creat (ref 0.024–0.184)
Total Protein, Urine: 7 mg/dL (ref 5–24)

## 2021-08-23 LAB — SEDIMENTATION RATE: Sed Rate: 9 mm/h (ref 0–30)

## 2021-08-23 LAB — ANTI-DNA ANTIBODY, DOUBLE-STRANDED: ds DNA Ab: <1 IU/mL

## 2021-08-23 LAB — C3 AND C4
C3 Complement: 149 mg/dL (ref 83–193)
C4 Complement: 24 mg/dL (ref 15–57)

## 2021-08-23 LAB — ANA: Anti Nuclear Antibody (ANA): NEGATIVE

## 2021-08-23 LAB — RHEUMATOID FACTOR: Rheumatoid fact SerPl-aCnc: <14 IU/mL (ref <14)

## 2021-10-21 DIAGNOSIS — J029 Acute pharyngitis, unspecified: Secondary | ICD-10-CM | POA: Diagnosis not present

## 2022-01-29 IMAGING — MG DIGITAL SCREENING BILAT W/ TOMO W/ CAD
6 of 10 series · 6 of 30 positions shown · non-contrast
Comparison: Previous exam(s).

CLINICAL DATA: Screening.

EXAM:
DIGITAL SCREENING BILATERAL MAMMOGRAM WITH TOMO AND CAD

[L XCCL synth-2D]
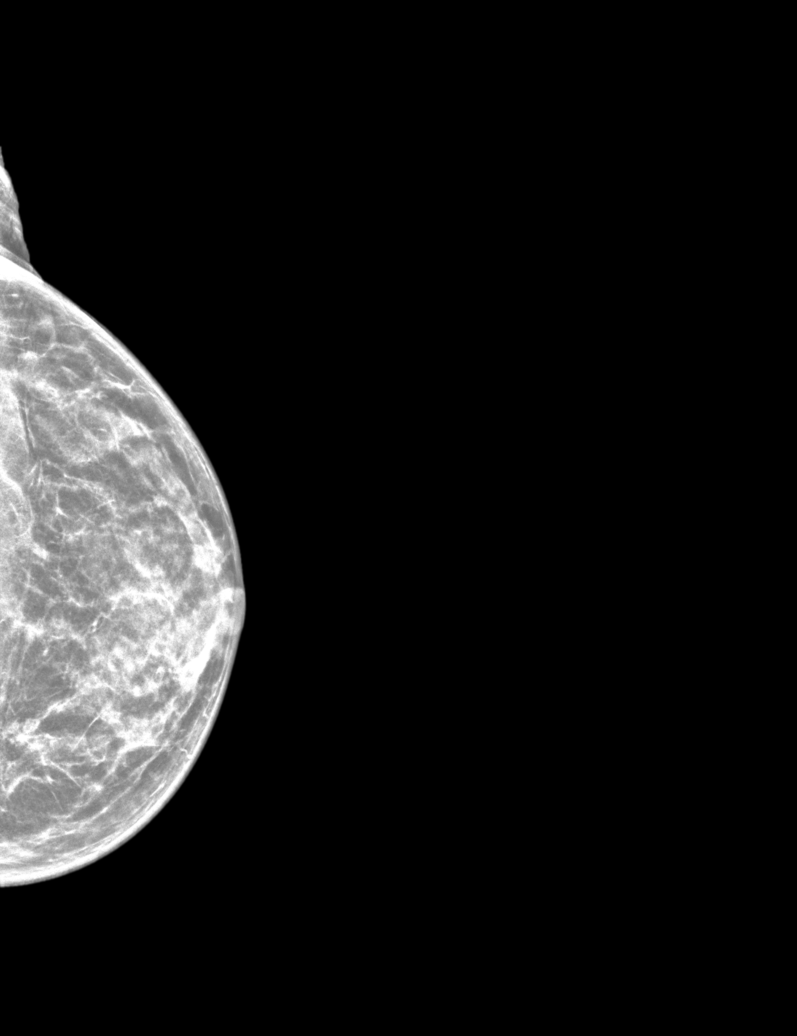

[L CC synth-2D]
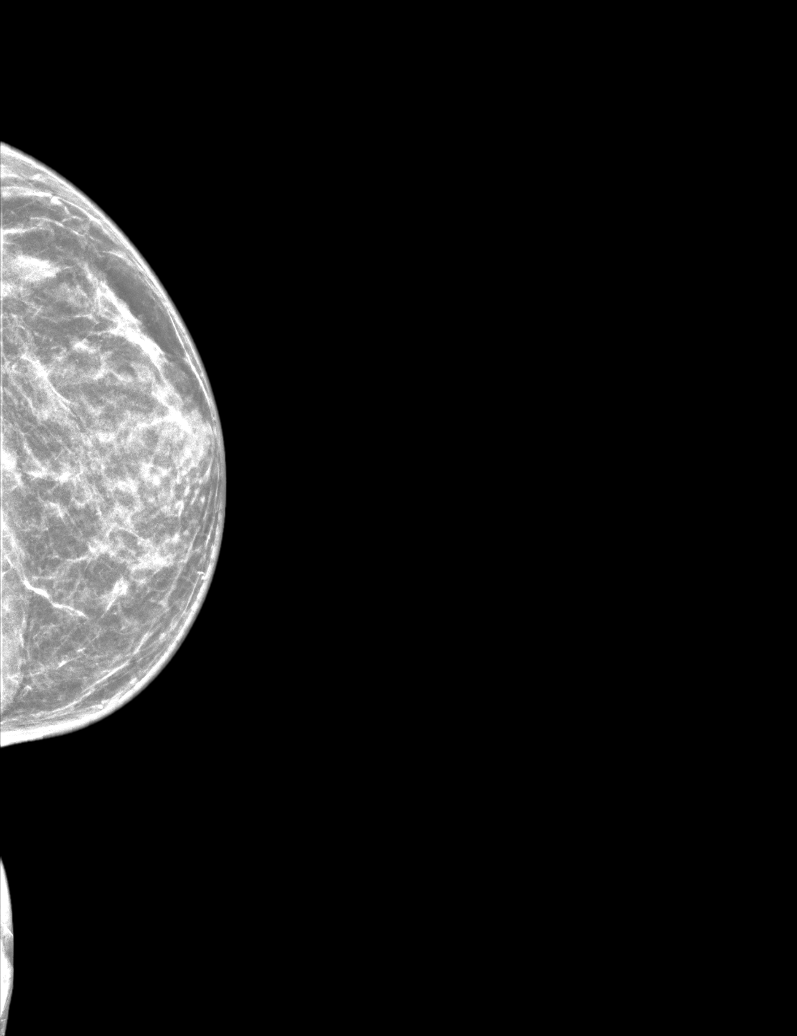

[R MLO synth-2D]
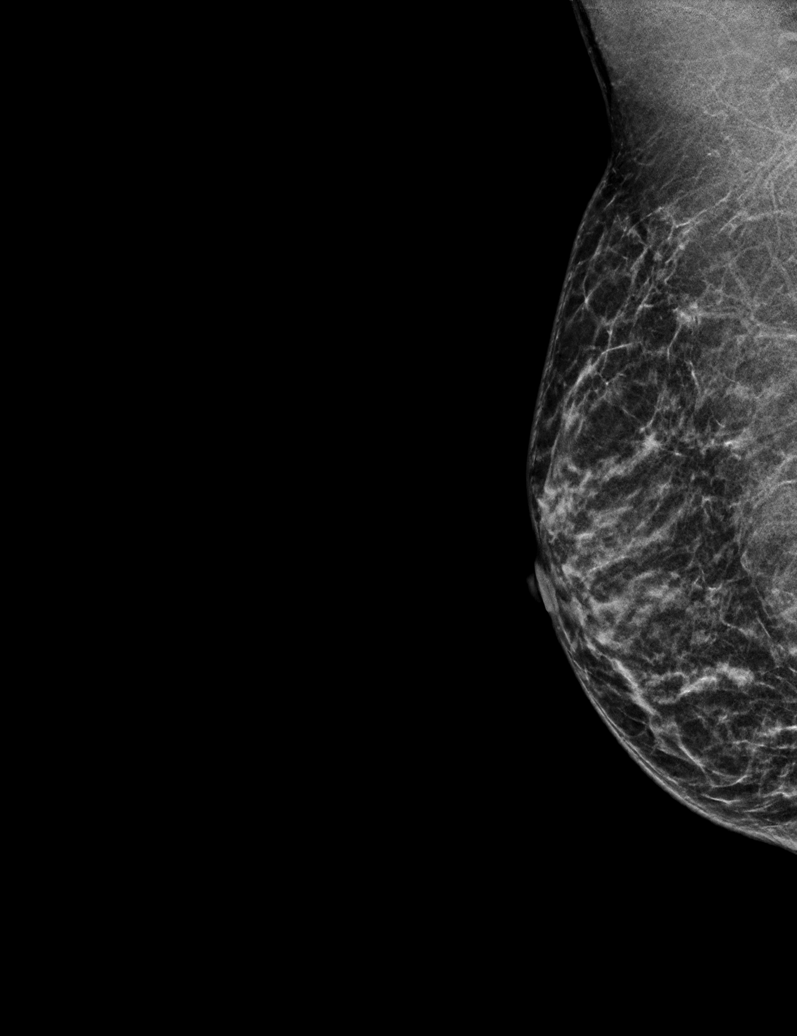

[L MLO synth-2D]
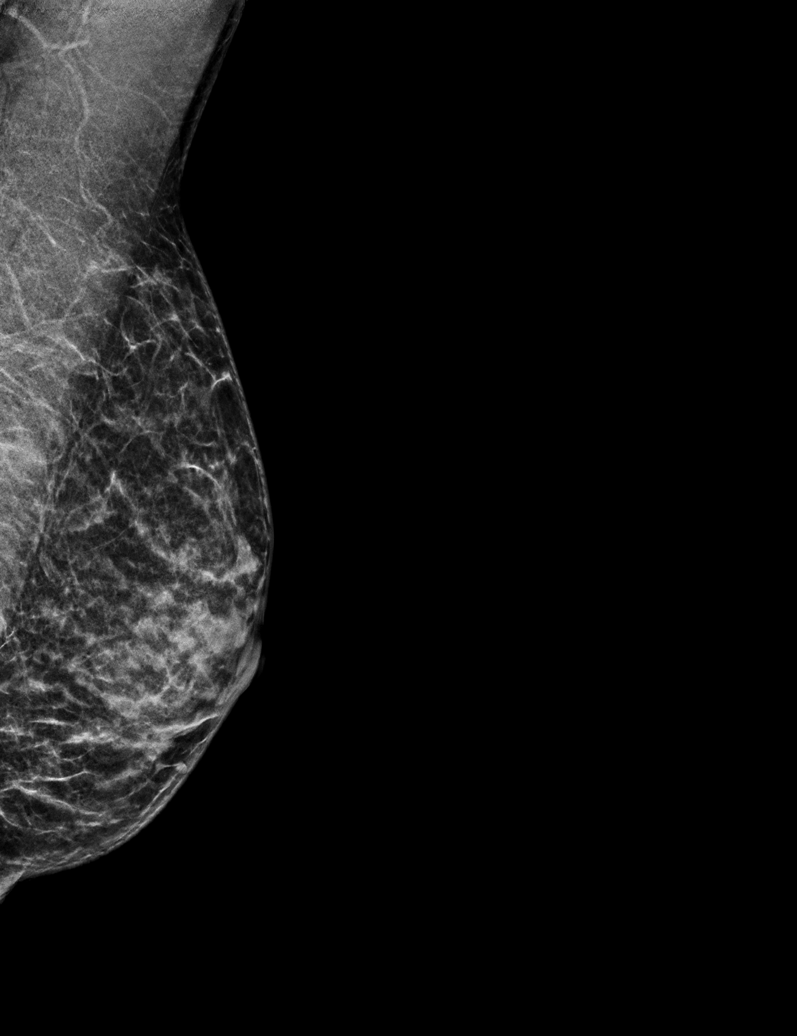

[R CC synth-2D]
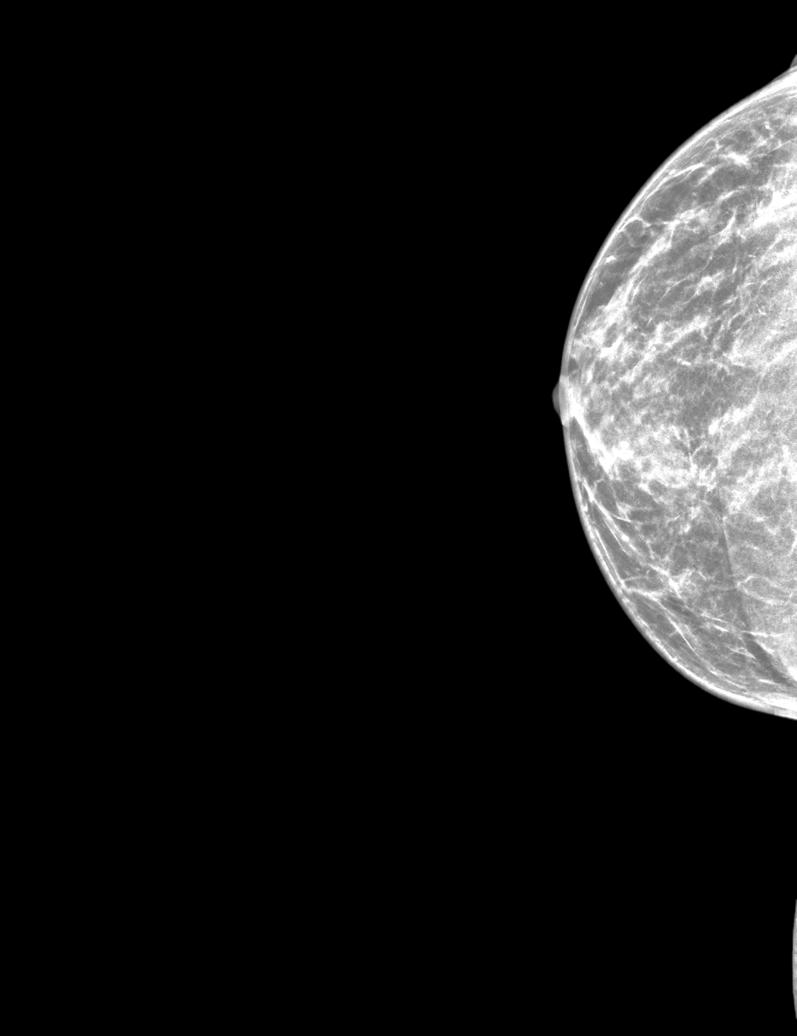

[R CC tomo · tomo slice 19/36.0]
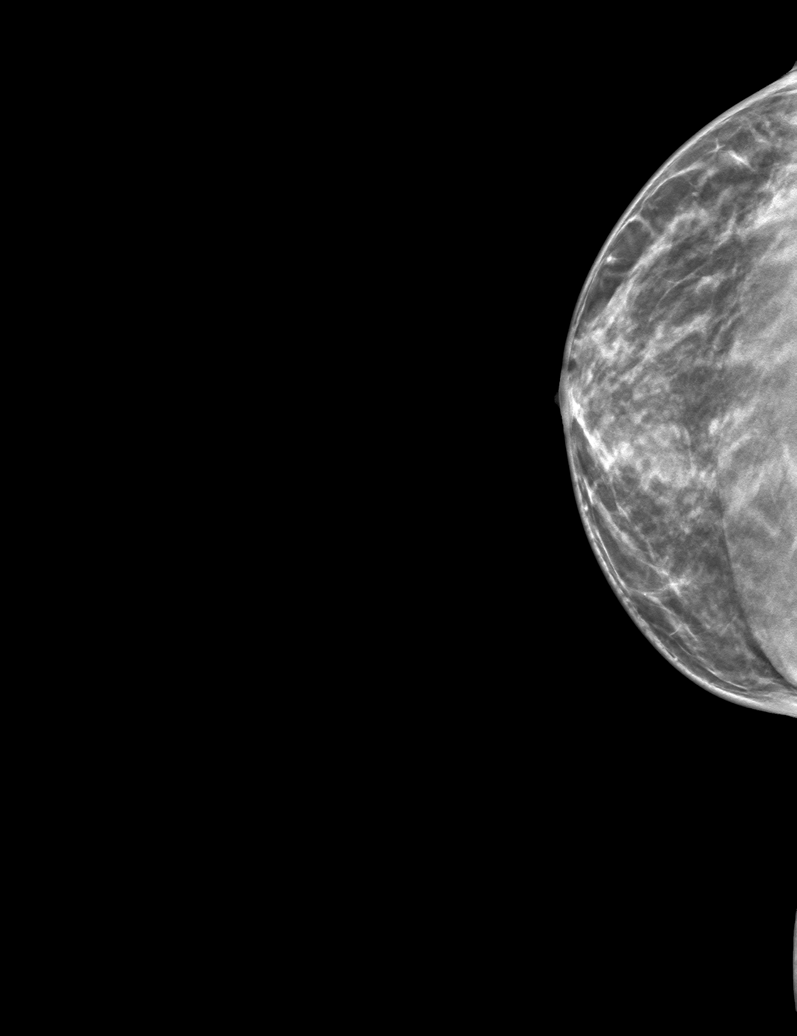

[6 of 30 positions shown; findings below may reference images not displayed]

ACR Breast Density Category c: The breast tissue is heterogeneously
dense, which may obscure small masses.
FINDINGS: There are no findings suspicious for malignancy. Images were
processed with CAD.
IMPRESSION: No mammographic evidence of malignancy. A result letter of this
screening mammogram will be mailed directly to the patient.

RECOMMENDATION:
Screening mammogram in one year. (Code:FT-[id number])

BI-RADS CATEGORY  1: Negative.

## 2022-02-10 ENCOUNTER — Other Ambulatory Visit: Payer: Self-pay | Admitting: Obstetrics and Gynecology

## 2022-02-10 DIAGNOSIS — Z1211 Encounter for screening for malignant neoplasm of colon: Secondary | ICD-10-CM | POA: Diagnosis not present

## 2022-02-10 DIAGNOSIS — Z124 Encounter for screening for malignant neoplasm of cervix: Secondary | ICD-10-CM | POA: Diagnosis not present

## 2022-02-10 DIAGNOSIS — Z1231 Encounter for screening mammogram for malignant neoplasm of breast: Secondary | ICD-10-CM

## 2022-02-10 DIAGNOSIS — Z01419 Encounter for gynecological examination (general) (routine) without abnormal findings: Secondary | ICD-10-CM | POA: Diagnosis not present

## 2022-03-14 ENCOUNTER — Ambulatory Visit
Admission: RE | Admit: 2022-03-14 | Discharge: 2022-03-14 | Disposition: A | Discharge status: Home / Self Care | Payer: BC Managed Care – PPO | Source: Ambulatory Visit | Attending: Obstetrics and Gynecology | Admitting: Obstetrics and Gynecology

## 2022-03-14 DIAGNOSIS — Z1231 Encounter for screening mammogram for malignant neoplasm of breast: Secondary | ICD-10-CM | POA: Diagnosis not present

## 2022-09-18 DIAGNOSIS — R1032 Left lower quadrant pain: Secondary | ICD-10-CM | POA: Diagnosis not present

## 2022-09-18 DIAGNOSIS — L931 Subacute cutaneous lupus erythematosus: Secondary | ICD-10-CM | POA: Diagnosis not present

## 2022-09-18 DIAGNOSIS — Z1331 Encounter for screening for depression: Secondary | ICD-10-CM | POA: Diagnosis not present

## 2022-09-18 DIAGNOSIS — Z13220 Encounter for screening for lipoid disorders: Secondary | ICD-10-CM | POA: Diagnosis not present

## 2022-09-18 DIAGNOSIS — Z131 Encounter for screening for diabetes mellitus: Secondary | ICD-10-CM | POA: Diagnosis not present

## 2022-09-18 DIAGNOSIS — H579 Unspecified disorder of eye and adnexa: Secondary | ICD-10-CM | POA: Diagnosis not present

## 2022-09-18 DIAGNOSIS — Z1159 Encounter for screening for other viral diseases: Secondary | ICD-10-CM | POA: Diagnosis not present

## 2022-09-18 DIAGNOSIS — Z1389 Encounter for screening for other disorder: Secondary | ICD-10-CM | POA: Diagnosis not present

## 2022-09-18 DIAGNOSIS — Z0000 Encounter for general adult medical examination without abnormal findings: Secondary | ICD-10-CM | POA: Diagnosis not present

## 2022-09-18 DIAGNOSIS — F4321 Adjustment disorder with depressed mood: Secondary | ICD-10-CM | POA: Diagnosis not present

## 2022-11-17 ENCOUNTER — Ambulatory Visit: Payer: BC Managed Care – PPO | Admitting: Nurse Practitioner

## 2022-11-24 ENCOUNTER — Ambulatory Visit (INDEPENDENT_AMBULATORY_CARE_PROVIDER_SITE_OTHER): Payer: BC Managed Care – PPO | Admitting: Nurse Practitioner

## 2022-11-24 ENCOUNTER — Encounter: Payer: Self-pay | Admitting: Nurse Practitioner

## 2022-11-24 VITALS — BP 128/74 | HR 70 | Temp 97.9°F | Ht 66.0 in | Wt 131.0 lb

## 2022-11-24 DIAGNOSIS — K219 Gastro-esophageal reflux disease without esophagitis: Secondary | ICD-10-CM

## 2022-11-24 DIAGNOSIS — L989 Disorder of the skin and subcutaneous tissue, unspecified: Secondary | ICD-10-CM | POA: Diagnosis not present

## 2022-11-24 DIAGNOSIS — I700 Atherosclerosis of aorta: Secondary | ICD-10-CM | POA: Diagnosis not present

## 2022-11-24 DIAGNOSIS — L932 Other local lupus erythematosus: Secondary | ICD-10-CM

## 2022-11-24 DIAGNOSIS — Z1211 Encounter for screening for malignant neoplasm of colon: Secondary | ICD-10-CM

## 2022-11-24 DIAGNOSIS — Z0000 Encounter for general adult medical examination without abnormal findings: Secondary | ICD-10-CM

## 2022-11-24 NOTE — Assessment & Plan Note (Signed)
Discussed age-appropriate immunizations and screening exams.  Patient is up-to-date with all vaccines.  Did encourage patient to consider getting the shingles vaccine of Shingrix.  Information discussed in given at discharge.  Ambulatory referral placed for CRC screening and she is overdue with a family history of colon cancer.  Patient is followed by GYN for breast cancer screening.  She no longer has a cervix for CRC screening.  Did review patient's personal, social, surgical, family histories.  Patient was given information at discharge about preventative healthcare maintenance with anticipatory guidance for age range.

## 2022-11-24 NOTE — Patient Instructions (Addendum)
Nice to see you today Make a fasting lab appointment over the next 2 weeks I will be in touch with the results once I have them Follow up with me in 1 year for your next physical, sooner if you need me  Consider getting the Shingles vaccine Shingrix

## 2022-11-24 NOTE — Progress Notes (Signed)
Established Patient Office Visit  Subjective   Patient ID: Cynthia Whitney, female    DOB: 1961-03-02  Age: 62 y.o. MRN: [id number]  Chief Complaint  Patient presents with   Transitions Of Care    Not fasting  Would like you to look at spot on her right upper cheek. Spot has been present for years, but over the past year she has noticed it changing in texture and spreading a little bit.     HPI  Aortic atherosclerosis: Patient currently not on statin therapy.  Last cholesterol well within normal limits.  Skin issue: states that she has had a spot on the cheekc for several years. States that it was the sixe of a pin. States that over the past year it has grown to the sie of a pea. No discharge bleeding or itching. States that she does gardened and covers up but no sunscreen   GERD: omeprazole as needed over the counter. States that she avoids her triggers. 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 With some snacks. States she will drink water. Will drink herbal teas and homemade lemonade Exercsie: taking care husband and gardening.  No structured exercise program.  Eye: sept 2023.  Patient mentions that she does have difficulty with double vision and lacks especially at night.  She was evaluated by the eye doctor.  She mentioned there may be some pressure issues she does have a follow-up with them.  Patient does wear glasses  Dentist: every 6 months    Mammo: 03/14/2022 with GYN Pap Smear: still has ovaries. Had a partial hysterectomy. She is followed GYN  Colonoscopy: 2014 with a FH of colon cancer. Over due. Would like referral to [facility]     Review of Systems  Constitutional:  Negative for chills and fever.  Respiratory:  Negative for shortness of breath.   Cardiovascular:  Negative for chest pain.   Gastrointestinal:  Negative for abdominal pain, constipation, diarrhea, nausea and vomiting.       Bm every other day   Genitourinary:  Negative for dysuria and hematuria.  Neurological:  Negative for headaches.  Psychiatric/Behavioral:  Negative for hallucinations and suicidal ideas.       Objective:     BP 128/74   Pulse 70   Temp 97.9 F (36.6 C) (Temporal)   Ht  (1.676 m)   Wt 131 lb (59.4 kg)   SpO2 99%   BMI 21.14 kg/m    Physical Exam Vitals and nursing note reviewed.  Constitutional:      Appearance: Normal appearance.  HENT:     Right Ear: Tympanic membrane, ear canal and external ear normal.     Left Ear: Tympanic membrane, ear canal and external ear normal.     Mouth/Throat:     Mouth: Mucous membranes are moist.     Pharynx: Oropharynx is clear.  Eyes:     Extraocular Movements: Extraocular movements intact.     Pupils: Pupils are equal, round, and reactive to light.  Cardiovascular:     Rate and Rhythm: Normal rate and regular rhythm.     Pulses: Normal pulses.     Heart sounds: Normal heart sounds.  Pulmonary:     Effort: Pulmonary effort is normal.     Breath sounds: Normal  breath sounds.  Musculoskeletal:     Right lower leg: No edema.     Left lower leg: No edema.  Lymphadenopathy:     Cervical: No cervical adenopathy.  Skin:    General: Skin is warm.       Neurological:     General: No focal deficit present.     Mental Status: She is alert.     Deep Tendon Reflexes:     Reflex Scores:      Bicep reflexes are 2+ on the right side and 2+ on the left side.      Patellar reflexes are 2+ on the right side and 2+ on the left side.    Comments: Bilateral upper and lower extremity strength 5/5  Psychiatric:        Mood and Affect: Mood normal.        Behavior: Behavior normal.        Thought Content: Thought content normal.        Judgment: Judgment normal.      No results found for any visits on 11/24/22.    The ASCVD Risk  score (Arnett DK, et al., 2019) failed to calculate for the following reasons:   The valid HDL cholesterol range is 20 to 100 mg/dL    Assessment & Plan:   Problem List Items Addressed This Visit       Cardiovascular and Mediastinum   Aortic atherosclerosis    Incidental finding on CT scan.  Patient's cholesterol under well control.  We did do a brief discussion about starting a statin pending cholesterol level this year      Relevant Orders   Lipid panel     Digestive   GERD (gastroesophageal reflux disease)    Patient states she uses omeprazole 20 mg as needed over-the-counter.  Will avoid triggers such as eating late at night and lying down.        Musculoskeletal and Integument   Skin lesion - Primary    Will send to dermatology for further evaluation and skin survey as patient is out of the sun a lot as gardening as her hobby.  She does not wear skin protection in the form sunscreen.  She states that she will wear a hat      Relevant Orders   Ambulatory referral to Dermatology   Cutaneous lupus erythematosus     Other   Preventative health care    Discussed age-appropriate immunizations and screening exams.  Patient is up-to-date with all vaccines.  Did encourage patient to consider getting the shingles vaccine of Shingrix.  Information discussed in given at discharge.  Ambulatory referral placed for CRC screening and she is overdue with a family history of colon cancer.  Patient is followed by GYN for breast cancer screening.  She no longer has a cervix for CRC screening.  Did review patient's personal, social, surgical, family histories.  Patient was given information at discharge about preventative healthcare maintenance with anticipatory guidance for age range.      Relevant Orders   CBC   Comprehensive metabolic panel   TSH   Other Visit Diagnoses     Screening for colon cancer       Relevant Orders   Ambulatory referral to Gastroenterology       Return in  about 1 year (around 11/24/2023) for CPE and Labs.    [name], NP

## 2022-11-24 NOTE — Assessment & Plan Note (Signed)
Patient states she uses omeprazole 20 mg as needed over-the-counter.  Will avoid triggers such as eating late at night and lying down.

## 2022-11-24 NOTE — Assessment & Plan Note (Signed)
Will send to dermatology for further evaluation and skin survey as patient is out of the sun a lot as gardening as her hobby.  She does not wear skin protection in the form sunscreen.  She states that she will wear a hat

## 2022-11-24 NOTE — Assessment & Plan Note (Signed)
Incidental finding on CT scan.  Patient's cholesterol under well control.  We did do a brief discussion about starting a statin pending cholesterol level this year

## 2022-11-27 ENCOUNTER — Other Ambulatory Visit: Payer: Self-pay

## 2022-11-27 ENCOUNTER — Telehealth: Payer: Self-pay

## 2022-11-27 DIAGNOSIS — Z800 Family history of malignant neoplasm of digestive organs: Secondary | ICD-10-CM

## 2022-11-27 DIAGNOSIS — Z1211 Encounter for screening for malignant neoplasm of colon: Secondary | ICD-10-CM

## 2022-11-27 MED ORDER — NA SULFATE-K SULFATE-MG SULF 17.5-3.13-1.6 GM/177ML PO SOLN: 1.0000 | Freq: Once | ORAL | 0 refills | Status: AC

## 2022-11-27 NOTE — Telephone Encounter (Signed)
Gastroenterology Pre-Procedure Review  Request Date: 01/09/23 Requesting Physician: Dr. Allegra Lai  PATIENT REVIEW QUESTIONS: The patient responded to the following health history questions as indicated:    1. Are you having any GI issues? no 2. Do you have a personal history of Polyps? no 3. Do you have a family history of Colon Cancer or Polyps? yes (mother colon cancer, maternal aunts colon cancer, maternal uncle) 4. Diabetes Mellitus? no 5. Joint replacements in the past 12 months?no 6. Major health problems in the past 3 months?no 7. Any artificial heart valves, MVP, or defibrillator?no    MEDICATIONS & ALLERGIES:    Patient reports the following regarding taking any anticoagulation/antiplatelet therapy:   Plavix, Coumadin, Eliquis, Xarelto, Lovenox, Pradaxa, Brilinta, or Effient? no Aspirin? no  Patient confirms/reports the following medications:  Current Outpatient Medications  Medication Sig Dispense Refill   B Complex-C (B-COMPLEX WITH VITAMIN C) tablet Take 1 tablet by mouth daily.     Biotin 10 MG CAPS Take 1 capsule by mouth.     cholecalciferol (VITAMIN D) 1000 units tablet Take 1,000 Units by mouth daily.     EVENING PRIMROSE OIL PO Take by mouth.     Multiple Vitamin (MULTIVITAMIN) tablet Take 1 tablet by mouth daily.     naproxen (NAPROSYN) 500 MG tablet Take 1 tablet (500 mg total) by mouth 2 (two) times daily with a meal. (Patient taking differently: Take 500 mg by mouth 2 (two) times daily as needed.) 60 tablet 2   Saccharomyces boulardii (PROBIOTIC) 250 MG CAPS Take by mouth.     No current facility-administered medications for this visit.    Patient confirms/reports the following allergies:  Allergies  Allergen Reactions   Hydroxychloroquine Other (See Comments) and Swelling    body aches/pain   Levofloxacin Swelling   Penicillins Rash   Phenazopyridine     Other reaction(s): Unknown Other reaction(s): Unknown    Hydroxychloroquine Sulfate Other (See  Comments)    body aches/pain    No orders of the defined types were placed in this encounter.   AUTHORIZATION INFORMATION Primary Insurance: 1D#: Group #:  Secondary Insurance: 1D#: Group #:  SCHEDULE INFORMATION: Date: 01/09/23 Time: Location: [facility]

## 2022-12-08 ENCOUNTER — Other Ambulatory Visit (INDEPENDENT_AMBULATORY_CARE_PROVIDER_SITE_OTHER): Payer: BC Managed Care – PPO

## 2022-12-08 DIAGNOSIS — Z0000 Encounter for general adult medical examination without abnormal findings: Secondary | ICD-10-CM

## 2022-12-08 DIAGNOSIS — I700 Atherosclerosis of aorta: Secondary | ICD-10-CM | POA: Diagnosis not present

## 2022-12-08 LAB — COMPREHENSIVE METABOLIC PANEL
ALT: 12 U/L (ref 0–35)
AST: 22 U/L (ref 0–37)
Albumin: 4 g/dL (ref 3.5–5.2)
Alkaline Phosphatase: 94 U/L (ref 39–117)
BUN: 16 mg/dL (ref 6–23)
CO2: 28 mEq/L (ref 19–32)
Calcium: 9.2 mg/dL (ref 8.4–10.5)
Chloride: 104 mEq/L (ref 96–112)
Creatinine, Ser: 1.15 mg/dL (ref 0.40–1.20)
GFR: 51.52 mL/min — ABNORMAL LOW (ref >60.00)
Glucose, Bld: 99 mg/dL (ref 70–99)
Potassium: 4.2 mEq/L (ref 3.5–5.1)
Sodium: 141 mEq/L (ref 135–145)
Total Bilirubin: 0.6 mg/dL (ref 0.2–1.2)
Total Protein: 6.8 g/dL (ref 6.0–8.3)

## 2022-12-08 LAB — CBC
HCT: 38.8 % (ref 36.0–46.0)
Hemoglobin: 13.2 g/dL (ref 12.0–15.0)
MCHC: 33.9 g/dL (ref 30.0–36.0)
MCV: 99.7 fl (ref 78.0–100.0)
Platelets: 255 10*3/uL (ref 150.0–400.0)
RBC: 3.89 Mil/uL (ref 3.87–5.11)
RDW: 12.8 % (ref 11.5–15.5)
WBC: 4.5 10*3/uL (ref 4.0–10.5)

## 2022-12-08 LAB — LIPID PANEL
Cholesterol: 153 mg/dL (ref 0–200)
HDL: 96.1 mg/dL (ref >39.00)
LDL Cholesterol: 49 mg/dL (ref 0–99)
NonHDL: 56.6
Total CHOL/HDL Ratio: 2
Triglycerides: 36 mg/dL (ref 0.0–149.0)
VLDL: 7.2 mg/dL (ref 0.0–40.0)

## 2022-12-08 LAB — TSH: TSH: 1.28 u[IU]/mL (ref 0.35–5.50)

## 2023-01-02 ENCOUNTER — Telehealth: Payer: Self-pay

## 2023-01-02 NOTE — Telephone Encounter (Signed)
Received message from [place], [place] at [facility] endoscopy dept as follows- "This patient Cynthia Whitney who is on Dr Verdis Prime schedule for [name] June 7th says she doesn't think she has her instructions, and she doesn't check her mychart, she is requesting can they be mailed to her?"   After receiving message I attempted to contact patient however her voicemail is currently full. Left message with patients daughter to have her mother call me.  DPR checked.  Thanks, [place], [place]

## 2023-01-05 NOTE — Telephone Encounter (Signed)
Pt returned phone call this morning.  She said her instructions came in the mail on Friday afternoon.  Thanks, [place], [place]

## 2023-01-09 ENCOUNTER — Other Ambulatory Visit: Payer: Self-pay

## 2023-01-09 ENCOUNTER — Ambulatory Visit
Admission: RE | Admit: 2023-01-09 | Discharge: 2023-01-09 | Disposition: A | Discharge status: Home / Self Care | Payer: BC Managed Care – PPO | Attending: Gastroenterology | Admitting: Gastroenterology

## 2023-01-09 ENCOUNTER — Ambulatory Visit: Payer: BC Managed Care – PPO | Admitting: Anesthesiology

## 2023-01-09 ENCOUNTER — Encounter: Payer: Self-pay | Admitting: Gastroenterology

## 2023-01-09 ENCOUNTER — Encounter
Admission: RE | Disposition: A | Discharge status: Home / Self Care | Payer: Self-pay | Source: Home / Self Care | Attending: Gastroenterology

## 2023-01-09 DIAGNOSIS — K635 Polyp of colon: Secondary | ICD-10-CM

## 2023-01-09 DIAGNOSIS — D122 Benign neoplasm of ascending colon: Secondary | ICD-10-CM | POA: Insufficient documentation

## 2023-01-09 DIAGNOSIS — Z1211 Encounter for screening for malignant neoplasm of colon: Secondary | ICD-10-CM | POA: Diagnosis not present

## 2023-01-09 DIAGNOSIS — Z800 Family history of malignant neoplasm of digestive organs: Secondary | ICD-10-CM | POA: Diagnosis not present

## 2023-01-09 DIAGNOSIS — M1990 Unspecified osteoarthritis, unspecified site: Secondary | ICD-10-CM | POA: Diagnosis not present

## 2023-01-09 HISTORY — PX: COLONOSCOPY WITH PROPOFOL: SHX5780

## 2023-01-09 SURGERY — COLONOSCOPY WITH PROPOFOL
Anesthesia: General

## 2023-01-09 MED ORDER — PROPOFOL 10 MG/ML IV BOLUS
INTRAVENOUS | Status: DC | PRN
  Administered 2023-01-09 (×4): 50 mg via INTRAVENOUS
  Administered 2023-01-09: 100 mg via INTRAVENOUS
  Administered 2023-01-09 (×2): 50 mg via INTRAVENOUS

## 2023-01-09 MED ORDER — SODIUM CHLORIDE 0.9 % IV SOLN: INTRAVENOUS | Status: DC

## 2023-01-09 MED ORDER — LIDOCAINE HCL (CARDIAC) PF 100 MG/5ML IV SOSY
PREFILLED_SYRINGE | INTRAVENOUS | Status: DC | PRN
  Administered 2023-01-09: 60 mg via INTRAVENOUS

## 2023-01-09 NOTE — Transfer of Care (Signed)
Immediate Anesthesia Transfer of Care Note  Patient: Cynthia Whitney  Procedure(s) Performed: COLONOSCOPY WITH PROPOFOL  Patient Location: PACU  Anesthesia Type:General  Level of Consciousness: drowsy  Airway & Oxygen Therapy: Patient Spontanous Breathing  Post-op Assessment: Report given to RN and Post -op Vital signs reviewed and stable  Post vital signs: Reviewed and stable  Last Vitals:  Vitals Value Taken Time  BP 91/54 01/09/23 1041  Temp    Pulse 75 01/09/23 1041  Resp 15 01/09/23 1041  SpO2 98 % 01/09/23 1041  Vitals shown include unvalidated device data.  Last Pain:  Vitals:   01/09/23 0926  TempSrc: Temporal  PainSc: 0-No pain         Complications: There were no known notable events for this encounter.

## 2023-01-09 NOTE — H&P (Signed)
Arlyss Repress, MD [address]  [address]  [place], [place] [address]  Main: [phone]  Fax: [phone] Pager: [phone]  Primary Care Physician:  Eden Emms, NP Primary Gastroenterologist:  Dr. Arlyss Repress  Pre-Procedure History & Physical: HPI:  Cynthia Whitney is a 62 y.o. female is here for an colonoscopy.   Past Medical History:  Diagnosis Date   Arrhythmia    Arthritis    Chicken pox    Frequent UTI 02/18/2012   GERD (gastroesophageal reflux disease)    Heart valve disease 01/21/2016    Past Surgical History:  Procedure Laterality Date   ABDOMINAL HYSTERECTOMY     CESAREAN SECTION     2   COLONOSCOPY  02/2014   ESOPHAGOGASTRODUODENOSCOPY (EGD) WITH PROPOFOL  11/12/2015   Procedure: ESOPHAGOGASTRODUODENOSCOPY (EGD) WITH PROPOFOL;  Surgeon: Wallace Cullens, MD;  Location: [facility] ENDOSCOPY;  Service: Gastroenterology;;   ESOPHAGOGASTRODUODENOSCOPY ENDOSCOPY  2015    Prior to Admission medications   Medication Sig Start Date End Date Taking? Authorizing Provider  B Complex-C (B-COMPLEX WITH VITAMIN C) tablet Take 1 tablet by mouth daily.    [provider]  Biotin 10 MG CAPS Take 1 capsule by mouth.    [provider]  cholecalciferol (VITAMIN D) 1000 units tablet Take 1,000 Units by mouth daily.    [provider]  EVENING PRIMROSE OIL PO Take by mouth.    [provider]  Multiple Vitamin (MULTIVITAMIN) tablet Take 1 tablet by mouth daily.    [provider]  naproxen (NAPROSYN) 500 MG tablet Take 1 tablet (500 mg total) by mouth 2 (two) times daily with a meal. Patient taking differently: Take 500 mg by mouth 2 (two) times daily as needed. 09/03/16   Lorre Munroe, NP  Saccharomyces boulardii (PROBIOTIC) 250 MG CAPS Take by mouth.    [provider]    Allergies as of 11/27/2022 - Review Complete 11/24/2022  Allergen Reaction Noted   Hydroxychloroquine Other (See Comments) and Swelling 01/21/2016    Levofloxacin Swelling 05/13/2016   Penicillins Rash 07/06/2014   Phenazopyridine  11/16/2019   Hydroxychloroquine sulfate Other (See Comments) 07/06/2014    Family History  Problem Relation Age of Onset   Arthritis Mother    Cancer Mother        colon   Heart disease Mother    Hypertension Mother    Kidney disease Mother    Diabetes Mother    Cancer Father        prostate   Heart disease Father    Mental illness Father    Leukemia Father    Heart disease Brother    Hypertension Brother    Diabetes Brother    Seizures Brother    Cancer Maternal Aunt        colon   Hypertension Maternal Aunt    Kidney disease Maternal Aunt    Diabetes Maternal Aunt    Heart disease Maternal Uncle    Hypertension Maternal Uncle    Kidney disease Maternal Uncle    Diabetes Maternal Uncle    Cancer Maternal Uncle        lung   Cancer Paternal Aunt        bone   Cancer Paternal Uncle    Heart disease Paternal Uncle    Arthritis Paternal Grandmother    Heart disease Paternal Grandmother    Heart disease Paternal Grandfather    Stroke Paternal Grandfather    Breast cancer Neg  Hx     Social History   Socioeconomic History   Marital status: Married    Spouse name: Maisie Fus   Number of children: 2   Years of education: Not on file   Highest education level: Not on file  Occupational History   Not on file  Tobacco Use   Smoking status: Never   Smokeless tobacco: Never  Vaping Use   Vaping Use: Never used  Substance and Sexual Activity   Alcohol use: Yes    Comment: rare on holidays   Drug use: No   Sexual activity: Not Currently  Other Topics Concern   Not on file  Social History Narrative   Homemaker      Monty (41)   Yasha (33) D      Hobbies; gardening    Social Determinants of Health   Financial Resource Strain: Not on file  Food Insecurity: Not on file  Transportation Needs: Not on file  Physical Activity: Not on file  Stress: Not on file  Social Connections:  Not on file  Intimate Partner Violence: Not on file    Review of Systems: See HPI, otherwise negative ROS  Physical Exam: BP 117/86   Pulse 76   Temp (!) 97.2 F (36.2 C) (Temporal)   Resp 14   Ht 5\' 6"  (1.676 m)   Wt 55.3 kg   SpO2 100%   BMI 19.69 kg/m  General:   Alert,  pleasant and cooperative in NAD Head:  Normocephalic and atraumatic. Neck:  Supple; no masses or thyromegaly. Lungs:  Clear throughout to auscultation.    Heart:  Regular rate and rhythm. Abdomen:  Soft, nontender and nondistended. Normal bowel sounds, without guarding, and without rebound.   Neurologic:  Alert and  oriented x4;  grossly normal neurologically.  Impression/Plan: Cynthia Whitney is here for a colonoscopy to be performed for colon cancer screening  Risks, benefits, limitations, and alternatives regarding  colonoscopy have been reviewed with the patient.  Questions have been answered.  All parties agreeable.   Lannette Donath, MD  01/09/2023, 10:02 AM

## 2023-01-09 NOTE — Anesthesia Preprocedure Evaluation (Addendum)
Anesthesia Evaluation  Patient identified by MRN, date of birth, ID band Patient awake    Reviewed: Allergy & Precautions, NPO status , Patient's Chart, lab work & pertinent test results  History of Anesthesia Complications Negative for: history of anesthetic complications  Airway Mallampati: III   Neck ROM: Full    Dental no notable dental hx.    Pulmonary neg pulmonary ROS   Pulmonary exam normal breath sounds clear to auscultation       Cardiovascular Exercise Tolerance: Good negative cardio ROS Normal cardiovascular exam Rhythm:Regular Rate:Normal     Neuro/Psych negative neurological ROS     GI/Hepatic ,GERD  ,,  Endo/Other  negative endocrine ROS    Renal/GU Renal disease (nephrolithiasis)     Musculoskeletal  (+) Arthritis ,    Abdominal   Peds  Hematology negative hematology ROS (+)   Anesthesia Other Findings   Reproductive/Obstetrics                             Anesthesia Physical Anesthesia Plan  ASA: 2  Anesthesia Plan: General   Post-op Pain Management:    Induction: Intravenous  PONV Risk Score and Plan: 3 and Propofol infusion, TIVA and Treatment may vary due to age or medical condition  Airway Management Planned: Natural Airway  Additional Equipment:   Intra-op Plan:   Post-operative Plan:   Informed Consent: I have reviewed the patients History and Physical, chart, labs and discussed the procedure including the risks, benefits and alternatives for the proposed anesthesia with the patient or authorized representative who has indicated his/her understanding and acceptance.       Plan Discussed with: CRNA  Anesthesia Plan Comments: (LMA/GETA backup discussed.  Patient consented for risks of anesthesia including but not limited to:  - adverse reactions to medications - damage to eyes, teeth, lips or other oral mucosa - nerve damage due to positioning   - sore throat or hoarseness - damage to heart, brain, nerves, lungs, other parts of body or loss of life  Informed patient about role of CRNA in peri- and intra-operative care.  Patient voiced understanding.)       Anesthesia Quick Evaluation

## 2023-01-09 NOTE — Op Note (Signed)
[facility] Gastroenterology Patient Name: Cynthia Whitney Procedure Date: 01/09/2023 9:59 AM MRN: [id number] Account #: [id number] Date of Birth: Jun 14, 1961 Admit Type: Outpatient Age: 62 Room: [facility] ENDO ROOM 2 Gender: Female Note Status: Finalized Instrument Name: Peds Colonoscope [id number] Procedure:             Colonoscopy Indications:           Screening for colon cancer: Family history of                         colorectal cancer in multiple 2nd degree relatives,                         Last colonoscopy 10 years ago Providers:             Toney Reil MD, MD Medicines:             General Anesthesia Complications:         No immediate complications. Estimated blood loss: None. Procedure:             Pre-Anesthesia Assessment:                        - Prior to the procedure, a History and Physical was                         performed, and patient medications and allergies were                         reviewed. The patient is competent. The risks and                         benefits of the procedure and the sedation options and                         risks were discussed with the patient. All questions                         were answered and informed consent was obtained.                         Patient identification and proposed procedure were                         verified by the physician, the nurse, the                         anesthesiologist, the anesthetist and the technician                         in the pre-procedure area in the procedure room in the                         endoscopy suite. Mental Status Examination: alert and                         oriented. Airway Examination: normal oropharyngeal  airway and neck mobility. Respiratory Examination:                         clear to auscultation. CV Examination: normal.                         Prophylactic Antibiotics: The patient does not require                          prophylactic antibiotics. Prior Anticoagulants: The                         patient has taken no anticoagulant or antiplatelet                         agents. ASA Grade Assessment: II - A patient with mild                         systemic disease. After reviewing the risks and                         benefits, the patient was deemed in satisfactory                         condition to undergo the procedure. The anesthesia                         plan was to use general anesthesia. Immediately prior                         to administration of medications, the patient was                         re-assessed for adequacy to receive sedatives. The                         heart rate, respiratory rate, oxygen saturations,                         blood pressure, adequacy of pulmonary ventilation, and                         response to care were monitored throughout the                         procedure. The physical status of the patient was                         re-assessed after the procedure.                        After obtaining informed consent, the colonoscope was                         passed under direct vision. Throughout the procedure,                         the patient's blood pressure, pulse, and oxygen  saturations were monitored continuously. The                         Colonoscope was introduced through the anus and                         advanced to the the cecum, identified by appendiceal                         orifice and ileocecal valve. The colonoscopy was                         performed without difficulty. The patient tolerated                         the procedure well. The quality of the bowel                         preparation was evaluated using the BBPS [facility] Bowel                         Preparation Scale) with scores of: Right Colon = 3,                         Transverse Colon = 3 and Left Colon = 3 (entire mucosa                          seen well with no residual staining, small fragments                         of stool or opaque liquid). The total BBPS score                         equals 9. The ileocecal valve, appendiceal orifice,                         and rectum were photographed. Findings:      The perianal and digital rectal examinations were normal. Pertinent       negatives include normal sphincter tone and no palpable rectal lesions.      A 5 mm polyp was found in the ascending colon. The polyp was sessile.       The polyp was removed with a cold snare. Resection and retrieval were       complete.      A 3 mm polyp was found in the ascending colon. The polyp was sessile.       The polyp was removed with a jumbo cold forceps. Resection and retrieval       were complete.      The retroflexed view of the distal rectum and anal verge was normal and       showed no anal or rectal abnormalities. Impression:            - One 5 mm polyp in the ascending colon, removed with                         a cold snare. Resected and retrieved.                        -  One 3 mm polyp in the ascending colon, removed with                         a jumbo cold forceps. Resected and retrieved.                        - The distal rectum and anal verge are normal on                         retroflexion view. Recommendation:        - Discharge patient to home (with escort).                        - Resume previous diet today.                        - Continue present medications.                        - Await pathology results.                        - Repeat colonoscopy in 5 years for surveillance. Procedure Code(s):     --- Professional ---                        [phone], Colonoscopy, flexible; with removal of                         tumor(s), polyp(s), or other lesion(s) by snare                         technique                        45380, 59, Colonoscopy, flexible; with biopsy, single                         or  multiple Diagnosis Code(s):     --- Professional ---                        Z80.0, Family history of malignant neoplasm of                         digestive organs                        D12.2, Benign neoplasm of ascending colon CPT copyright 2022 American Medical Association. All rights reserved. The codes documented in this report are preliminary and upon coder review may  be revised to meet current compliance requirements. Dr. Libby Maw Toney Reil MD, MD 01/09/2023 10:38:41 AM This report has been signed electronically. Number of Addenda: 0 Note Initiated On: 01/09/2023 9:59 AM Scope Withdrawal Time: 0 hours 13 minutes 0 seconds  Total Procedure Duration: 0 hours 21 minutes 3 seconds  Estimated Blood Loss:  Estimated blood loss: none.      [facility]

## 2023-01-09 NOTE — Anesthesia Postprocedure Evaluation (Signed)
Anesthesia Post Note  Patient: Alveda A Solorio  Procedure(s) Performed: COLONOSCOPY WITH PROPOFOL  Patient location during evaluation: PACU Anesthesia Type: General Level of consciousness: awake and alert, oriented and patient cooperative Pain management: pain level controlled Vital Signs Assessment: post-procedure vital signs reviewed and stable Respiratory status: spontaneous breathing, nonlabored ventilation and respiratory function stable Cardiovascular status: blood pressure returned to baseline and stable Postop Assessment: adequate PO intake Anesthetic complications: no   There were no known notable events for this encounter.   Last Vitals:  Vitals:   01/09/23 1110 01/09/23 1112  BP:  128/75  Pulse: 74 74  Resp: 17 12  Temp:    SpO2: 100% 100%    Last Pain:  Vitals:   01/09/23 0926  TempSrc: Temporal  PainSc: 0-No pain                 Reed Breech

## 2023-01-09 NOTE — Addendum Note (Signed)
Addendum  created 01/09/23 1317 by Lysbeth Penner, CRNA   Flowsheet accepted, Intraprocedure Flowsheets edited

## 2023-01-12 ENCOUNTER — Encounter: Payer: Self-pay | Admitting: Gastroenterology

## 2023-01-13 ENCOUNTER — Telehealth: Payer: Self-pay | Admitting: Gastroenterology

## 2023-01-13 ENCOUNTER — Encounter: Payer: Self-pay | Admitting: Gastroenterology

## 2023-01-13 NOTE — Telephone Encounter (Signed)
Idk why it didn't but if you go to the path it still says collected but if you scroll down it is scanned in.

## 2023-01-13 NOTE — Telephone Encounter (Signed)
Pt left message needs call back to discuss colonoscopy results

## 2023-01-13 NOTE — Telephone Encounter (Signed)
Patient states she received her path results a hour ago and was calling for the results. Informed patient that the provider has not reviewed them but we would call her as soon as she had.

## 2023-02-03 ENCOUNTER — Telehealth: Payer: Self-pay | Admitting: Gastroenterology

## 2023-02-03 NOTE — Telephone Encounter (Signed)
Pt left message for a call back to discuss issues with results being sent to my chart

## 2023-02-03 NOTE — Telephone Encounter (Signed)
This is a path that was not sent to dr. Allegra Lai and the patient called about the results on 01/13/2023. The letter was created by Dr. Allegra Lai and sent to the patient on mychart and we called her with the results. Patient states she does not have it on mychart. She states she has been looking under results. Informed her she has to go under communications and then letters and that is where she would see the letter from dr Allegra Lai. Offered to mail her to letter also she said no she could pull it up on her mychart account

## 2023-02-12 ENCOUNTER — Other Ambulatory Visit: Payer: Self-pay | Admitting: Obstetrics and Gynecology

## 2023-02-12 DIAGNOSIS — Z1231 Encounter for screening mammogram for malignant neoplasm of breast: Secondary | ICD-10-CM

## 2023-02-12 DIAGNOSIS — Z1239 Encounter for other screening for malignant neoplasm of breast: Secondary | ICD-10-CM | POA: Diagnosis not present

## 2023-02-12 DIAGNOSIS — Z01419 Encounter for gynecological examination (general) (routine) without abnormal findings: Secondary | ICD-10-CM | POA: Diagnosis not present

## 2023-03-03 DIAGNOSIS — L708 Other acne: Secondary | ICD-10-CM | POA: Diagnosis not present

## 2023-03-03 DIAGNOSIS — L309 Dermatitis, unspecified: Secondary | ICD-10-CM | POA: Diagnosis not present

## 2023-03-17 ENCOUNTER — Ambulatory Visit
Admission: RE | Admit: 2023-03-17 | Discharge: 2023-03-17 | Disposition: A | Discharge status: Home / Self Care | Payer: BC Managed Care – PPO | Source: Ambulatory Visit | Attending: Obstetrics and Gynecology | Admitting: Obstetrics and Gynecology

## 2023-03-17 DIAGNOSIS — Z1231 Encounter for screening mammogram for malignant neoplasm of breast: Secondary | ICD-10-CM | POA: Insufficient documentation

## 2023-04-28 ENCOUNTER — Ambulatory Visit (INDEPENDENT_AMBULATORY_CARE_PROVIDER_SITE_OTHER): Payer: BC Managed Care – PPO | Admitting: Nurse Practitioner

## 2023-04-28 VITALS — BP 100/80 | HR 67 | Temp 97.9°F | Ht 66.0 in | Wt 132.6 lb

## 2023-04-28 DIAGNOSIS — L989 Disorder of the skin and subcutaneous tissue, unspecified: Secondary | ICD-10-CM

## 2023-04-28 DIAGNOSIS — Z23 Encounter for immunization: Secondary | ICD-10-CM | POA: Diagnosis not present

## 2023-04-28 NOTE — Patient Instructions (Addendum)
Use some over the counter hydrocortisone cream twice a day for the next 5-7 days   Schedule a nurse visit for 3 months for your second shingles vaccine    Lucy Antigua, MD  [facility] Dermatology [address] [place], [place] [address] [phone]

## 2023-04-28 NOTE — Progress Notes (Signed)
Acute Office Visit  Subjective:     Patient ID: Cynthia Whitney, female    DOB: June 22, 1961, 62 y.o.   MRN: [id number]  Chief Complaint  Patient presents with   Ear Pain    Ongoing for several years. Pt complains of pain to touch in right ear. Pt states the pain is located in the back of ear bone.     HPI Patient is in today for Ear pain with a history of Aortic atherosclerosis, GERD, Cutaneous lupus erythematosus  Left side that has been a couple years. States that she has had discoloration on bilater ear lobes. States that the left ear cartaligae was swollen and sticking out that started approx 1 week ago.  States that she did have some ringing on the left side that lasted for 2 days  States that she has tried putting coco butter on it. Has helped   Review of Systems  Constitutional:  Negative for chills and fever.  Respiratory:  Negative for shortness of breath.   Cardiovascular:  Negative for chest pain.  Neurological:  Negative for headaches.        Objective:    BP 100/80   Pulse 67   Temp 97.9 F (36.6 C) (Temporal)   Ht 5\' 6"  (1.676 m)   Wt 132 lb 9.6 oz (60.1 kg)   SpO2 95%   BMI 21.40 kg/m    Physical Exam Vitals and nursing note reviewed.  Constitutional:      Appearance: Normal appearance.  HENT:     Right Ear: Tympanic membrane, ear canal and external ear normal.     Left Ear: Tympanic membrane and ear canal normal.     Ears:      Mouth/Throat:     Mouth: Mucous membranes are moist.     Pharynx: Oropharynx is clear.  Cardiovascular:     Rate and Rhythm: Normal rate and regular rhythm.     Heart sounds: Normal heart sounds.  Pulmonary:     Effort: Pulmonary effort is normal.     Breath sounds: Normal breath sounds.  Lymphadenopathy:     Cervical: No cervical adenopathy.  Neurological:     Mental Status: She is alert.     No results found for any visits on 04/28/23.      Assessment & Plan:   Problem List Items Addressed This Visit        Musculoskeletal and Integument   Skin lesion - Primary    Will try hydrocortisone 1% twice daily for 5 to 7 days.  Topical steroid precautions reviewed.  Patient was referred to dermatology in the past but never heard from them gave patient information from the referral at discharge today      Other Visit Diagnoses     Need for shingles vaccine       Relevant Orders   Zoster Recombinant (Shingrix ) (Completed)       No orders of the defined types were placed in this encounter.   Return if symptoms worsen or fail to improve.  [name], NP

## 2023-04-28 NOTE — Assessment & Plan Note (Signed)
Will try hydrocortisone 1% twice daily for 5 to 7 days.  Topical steroid precautions reviewed.  Patient was referred to dermatology in the past but never heard from them gave patient information from the referral at discharge today

## 2023-05-12 DIAGNOSIS — L309 Dermatitis, unspecified: Secondary | ICD-10-CM | POA: Diagnosis not present

## 2023-05-12 DIAGNOSIS — L810 Postinflammatory hyperpigmentation: Secondary | ICD-10-CM | POA: Diagnosis not present

## 2023-08-04 ENCOUNTER — Ambulatory Visit (INDEPENDENT_AMBULATORY_CARE_PROVIDER_SITE_OTHER): Payer: BC Managed Care – PPO

## 2023-08-04 DIAGNOSIS — Z23 Encounter for immunization: Secondary | ICD-10-CM

## 2023-08-04 NOTE — Progress Notes (Signed)
 Per orders of Mordecai Maes, NP , injection of shingrix given by Lewanda Rife in right deltoid. Patient tolerated injection well.

## 2023-08-06 ENCOUNTER — Emergency Department: Payer: BC Managed Care – PPO

## 2023-08-06 ENCOUNTER — Other Ambulatory Visit: Payer: Self-pay

## 2023-08-06 ENCOUNTER — Encounter: Payer: Self-pay | Admitting: Emergency Medicine

## 2023-08-06 DIAGNOSIS — R0789 Other chest pain: Secondary | ICD-10-CM | POA: Diagnosis not present

## 2023-08-06 DIAGNOSIS — R0602 Shortness of breath: Secondary | ICD-10-CM | POA: Insufficient documentation

## 2023-08-06 DIAGNOSIS — R002 Palpitations: Secondary | ICD-10-CM | POA: Insufficient documentation

## 2023-08-06 DIAGNOSIS — R072 Precordial pain: Secondary | ICD-10-CM | POA: Insufficient documentation

## 2023-08-06 DIAGNOSIS — R079 Chest pain, unspecified: Secondary | ICD-10-CM | POA: Diagnosis not present

## 2023-08-06 LAB — CBC
HCT: 36.5 % (ref 36.0–46.0)
Hemoglobin: 12.2 g/dL (ref 12.0–15.0)
MCH: 33.5 pg (ref 26.0–34.0)
MCHC: 33.4 g/dL (ref 30.0–36.0)
MCV: 100.3 fL — ABNORMAL HIGH (ref 80.0–100.0)
Platelets: 255 10*3/uL (ref 150–400)
RBC: 3.64 MIL/uL — ABNORMAL LOW (ref 3.87–5.11)
RDW: 11.7 % (ref 11.5–15.5)
WBC: 5.8 10*3/uL (ref 4.0–10.5)
nRBC: 0 % (ref 0.0–0.2)

## 2023-08-06 LAB — BASIC METABOLIC PANEL
Anion gap: 10 (ref 5–15)
BUN: 20 mg/dL (ref 8–23)
CO2: 23 mmol/L (ref 22–32)
Calcium: 9 mg/dL (ref 8.9–10.3)
Chloride: 106 mmol/L (ref 98–111)
Creatinine, Ser: 0.91 mg/dL (ref 0.44–1.00)
GFR, Estimated: >60 mL/min (ref >60)
Glucose, Bld: 107 mg/dL — ABNORMAL HIGH (ref 70–99)
Potassium: 4.4 mmol/L (ref 3.5–5.1)
Sodium: 139 mmol/L (ref 135–145)

## 2023-08-06 LAB — TROPONIN I (HIGH SENSITIVITY): Troponin I (High Sensitivity): <2 ng/L (ref <18)

## 2023-08-06 NOTE — ED Triage Notes (Signed)
 Pt arrives via po ambulatory to triage c/o intermittent centralized chest pain x 1 month. CP radiates to the back, associated with SOB, dizziness, palpitations, and nausea. Nothing makes it better/worse.

## 2023-08-07 ENCOUNTER — Emergency Department
Admission: EM | Admit: 2023-08-07 | Discharge: 2023-08-07 | Disposition: A | Discharge status: Home / Self Care | Payer: BC Managed Care – PPO | Attending: Emergency Medicine | Admitting: Emergency Medicine

## 2023-08-07 DIAGNOSIS — R002 Palpitations: Secondary | ICD-10-CM

## 2023-08-07 DIAGNOSIS — R072 Precordial pain: Secondary | ICD-10-CM | POA: Diagnosis not present

## 2023-08-07 DIAGNOSIS — R079 Chest pain, unspecified: Secondary | ICD-10-CM

## 2023-08-07 LAB — TROPONIN I (HIGH SENSITIVITY): Troponin I (High Sensitivity): <2 ng/L (ref <18)

## 2023-08-07 LAB — T4, FREE: Free T4: 0.76 ng/dL (ref 0.61–1.12)

## 2023-08-07 LAB — MAGNESIUM: Magnesium: 2.4 mg/dL (ref 1.7–2.4)

## 2023-08-07 LAB — TSH: TSH: 1.956 u[IU]/mL (ref 0.350–4.500)

## 2023-08-07 LAB — D-DIMER, QUANTITATIVE: D-Dimer, Quant: 0.4 ug{FEU}/mL (ref 0.00–0.50)

## 2023-08-07 NOTE — ED Notes (Signed)
 Pt cleared for discharge by edp. Pt verbalized understanding of discharge instructions denies any additional questions.

## 2023-08-07 NOTE — ED Provider Notes (Signed)
 [facility] Provider Note    Event Date/Time   First MD Initiated Contact with Patient 08/07/23 0117     (approximate)   History   Chest Pain   HPI  Cynthia Whitney is a 63 y.o. female with history of GERD who presents to the emergency department with intermittent substernal chest pain with radiation into her back, associated shortness of breath, palpitations ongoing intermittently for the past month.  States she has seen a cardiologist but has been years ago.  She has not seen her PCP for this.  Denies fevers, cough.  No history of PE or DVT.  No lower extremity swelling or discomfort.  No exogenous estrogen use.  No prolonged immobilization recently.  Not having any symptoms currently.  Denies vomiting, diarrhea.   History provided by patient.    Past Medical History:  Diagnosis Date   Arrhythmia    Arthritis    Chicken pox    Frequent UTI 02/18/2012   GERD (gastroesophageal reflux disease)    Heart valve disease 01/21/2016    Past Surgical History:  Procedure Laterality Date   ABDOMINAL HYSTERECTOMY     CESAREAN SECTION     2   COLONOSCOPY  02/2014   COLONOSCOPY WITH PROPOFOL  N/A 01/09/2023   Procedure: COLONOSCOPY WITH PROPOFOL ;  Surgeon: Unk Corinn Skiff, MD;  Location: [facility] ENDOSCOPY;  Service: Gastroenterology;  Laterality: N/A;   ESOPHAGOGASTRODUODENOSCOPY (EGD) WITH PROPOFOL   11/12/2015   Procedure: ESOPHAGOGASTRODUODENOSCOPY (EGD) WITH PROPOFOL ;  Surgeon: Deward CINDERELLA Piedmont, MD;  Location: [facility] ENDOSCOPY;  Service: Gastroenterology;;   ESOPHAGOGASTRODUODENOSCOPY ENDOSCOPY  2015    MEDICATIONS:  Prior to Admission medications   Medication Sig Start Date End Date Taking? Authorizing Provider  B Complex-C (B-COMPLEX WITH VITAMIN C) tablet Take 1 tablet by mouth daily.    [provider]  Biotin 10 MG CAPS Take 1 capsule by mouth.    [provider]  cholecalciferol (VITAMIN D) 1000 units tablet Take 1,000 Units by mouth daily.     [provider]  EVENING PRIMROSE OIL PO Take by mouth.    [provider]  Multiple Vitamin (MULTIVITAMIN) tablet Take 1 tablet by mouth daily.    [provider]  naproxen  (NAPROSYN ) 500 MG tablet Take 1 tablet (500 mg total) by mouth 2 (two) times daily with a meal. Patient not taking: Reported on 04/28/2023 09/03/16   Antonette Angeline ORN, NP  Saccharomyces boulardii (PROBIOTIC) 250 MG CAPS Take by mouth.    [provider]    Physical Exam   Triage Vital Signs: ED Triage Vitals  Encounter Vitals Group     BP 08/06/23 2211 122/81     Systolic BP Percentile --      Diastolic BP Percentile --      Pulse Rate 08/06/23 2211 83     Resp 08/06/23 2211 14     Temp 08/06/23 2211 98 F (36.7 C)     Temp Source 08/06/23 2211 Oral     SpO2 08/06/23 2211 100 %     Weight 08/06/23 2212 132 lb (59.9 kg)     Height 08/06/23 2212 5' 6 (1.676 m)     Head Circumference --      Peak Flow --      Pain Score 08/06/23 2212 2     Pain Loc --      Pain Education --      Exclude from Growth Chart --     Most recent vital  signs: Vitals:   08/07/23 0145 08/07/23 0200  BP: 113/75 120/87  Pulse: 76 76  Resp: 13 17  Temp: 97.9 F (36.6 C)   SpO2: 100% 100%    CONSTITUTIONAL: Alert, responds appropriately to questions. Well-appearing; well-nourished HEAD: Normocephalic, atraumatic EYES: Conjunctivae clear, pupils appear equal, sclera nonicteric ENT: normal nose; moist mucous membranes NECK: Supple, normal ROM CARD: RRR; S1 and S2 appreciated RESP: Normal chest excursion without splinting or tachypnea; breath sounds clear and equal bilaterally; no wheezes, no rhonchi, no rales, no hypoxia or respiratory distress, speaking full sentences ABD/GI: Non-distended; soft, non-tender, no rebound, no guarding, no peritoneal signs BACK: The back appears normal EXT: Normal ROM in all joints; no deformity noted, no edema SKIN: Normal color for age and race; warm; no rash  on exposed skin NEURO: Moves all extremities equally, normal speech PSYCH: The patient's mood and manner are appropriate.   ED Results / Procedures / Treatments   LABS: (all labs ordered are listed, but only abnormal results are displayed) Labs Reviewed  BASIC METABOLIC PANEL - Abnormal; Notable for the following components:      Result Value   Glucose, Bld 107 (*)    All other components within normal limits  CBC - Abnormal; Notable for the following components:   RBC 3.64 (*)    MCV 100.3 (*)    All other components within normal limits  MAGNESIUM   TSH  T4, FREE  D-DIMER, QUANTITATIVE  TROPONIN I (HIGH SENSITIVITY)  TROPONIN I (HIGH SENSITIVITY)     EKG:  EKG Interpretation Date/Time:  Friday August 07 2023 01:23:18 EST Ventricular Rate:  65 PR Interval:  156 QRS Duration:  94 QT Interval:  396 QTC Calculation: 411 R Axis:   84  Text Interpretation: Normal sinus rhythm with sinus arrhythmia Normal ECG When compared with ECG of 06-Aug-2023 22:20, Nonspecific T wave abnormality no longer evident in Lateral leads Confirmed by Neomi Neptune [phone]) on 08/07/2023 1:25:18 AM         RADIOLOGY: My personal review and interpretation of imaging: Chest x-ray clear.  I have personally reviewed all radiology reports.   DG Chest 2 View Result Date: 08/06/2023 CLINICAL DATA:  CP EXAM: CHEST - 2 VIEW COMPARISON:  Chest x-ray 01/08/2016 FINDINGS: The heart and mediastinal contours are within normal limits. No focal consolidation. No pulmonary edema. No pleural effusion. No pneumothorax. No acute osseous abnormality. IMPRESSION: No active cardiopulmonary disease. Electronically Signed   By: Morgane  Naveau M.D.   On: 08/06/2023 23:41     PROCEDURES:  Critical Care performed: No      Procedures    IMPRESSION / MDM / ASSESSMENT AND PLAN / ED COURSE  I reviewed the triage vital signs and the nursing notes.    Patient here with complaints of chest pain, shortness of  breath, palpitations.  The patient is on the cardiac monitor to evaluate for evidence of arrhythmia and/or significant heart rate changes.   DIFFERENTIAL DIAGNOSIS (includes but not limited to):   ACS, anemia, electrolyte derangement, thyroid  dysfunction, PE, less likely dissection   Patient's presentation is most consistent with acute presentation with potential threat to life or bodily function.   PLAN: EKG nonischemic.  First troponin negative.  Second pending.  Chest x-ray reviewed and interpreted by myself and the radiologist and is clear.  Potassium normal.  Will check magnesium , TSH.  Will obtain D-dimer given she does report some pleuritic component to her pain.   MEDICATIONS GIVEN IN ED: Medications - No  data to display   ED COURSE: Second troponin negative.  D-dimer negative.  Normal TSH and free T4.  Normal magnesium .  Will discharge home with close outpatient follow-up.  Have placed referral for cardiology.  Patient comfortable with this plan.   At this time, I do not feel there is any life-threatening condition present. I reviewed all nursing notes, vitals, pertinent previous records.  All lab and urine results, EKGs, imaging ordered have been independently reviewed and interpreted by myself.  I reviewed all available radiology reports from any imaging ordered this visit.  Based on my assessment, I feel the patient is safe to be discharged home without further emergent workup and can continue workup as an outpatient as needed. Discussed all findings, treatment plan as well as usual and customary return precautions.  They verbalize understanding and are comfortable with this plan.  Outpatient follow-up has been provided as needed.  All questions have been answered.   CONSULTS: none   OUTSIDE RECORDS REVIEWED: Reviewed last OB/GYN note on 02/12/2023.       FINAL CLINICAL IMPRESSION(S) / ED DIAGNOSES   Final diagnoses:  Nonspecific chest pain  Palpitations     Rx /  DC Orders   ED Discharge Orders          Ordered    Ambulatory referral to Cardiology       Comments: If you have not heard from the Cardiology office within the next 72 hours please call [phone].   08/07/23 0140             Note:  This document was prepared using Dragon voice recognition software and may include unintentional dictation errors.   Gwynevere Lizana, Josette SAILOR, DO 08/07/23 [phone]

## 2023-08-07 NOTE — ED Notes (Signed)
 AVS provided by edp was discussed with pt. Pt sts she no longer want to leave AMA as insurance will not cover her visit today. Pt willingness to stay was communicated with EDP ward

## 2023-08-07 NOTE — ED Notes (Signed)
 Pt refused placement of IV when drawing labs. Importance of labs and iv placement discussed with pt. Pt agreeable to lab collection at this time. 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

## 2023-08-11 ENCOUNTER — Ambulatory Visit (INDEPENDENT_AMBULATORY_CARE_PROVIDER_SITE_OTHER): Payer: BC Managed Care – PPO | Admitting: Nurse Practitioner

## 2023-08-11 VITALS — BP 100/80 | HR 75 | Temp 98.2°F | Ht 66.0 in | Wt 137.6 lb

## 2023-08-11 DIAGNOSIS — F419 Anxiety disorder, unspecified: Secondary | ICD-10-CM | POA: Diagnosis not present

## 2023-08-11 DIAGNOSIS — F439 Reaction to severe stress, unspecified: Secondary | ICD-10-CM | POA: Diagnosis not present

## 2023-08-11 DIAGNOSIS — Z09 Encounter for follow-up examination after completed treatment for conditions other than malignant neoplasm: Secondary | ICD-10-CM | POA: Insufficient documentation

## 2023-08-11 MED ORDER — HYDROXYZINE HCL 10 MG PO TABS: 10.0000 mg | ORAL_TABLET | Freq: Three times a day (TID) | ORAL | 0 refills | Status: DC | PRN

## 2023-08-11 NOTE — Progress Notes (Signed)
   Established Patient Office Visit  Subjective   Patient ID: Cynthia Whitney, female    DOB: 05-15-1961  Age: 63 y.o. MRN: [id number]  Chief Complaint  Patient presents with   Hospitalization Follow-up    Pt complains of feeling better since ER visit. States of no chest pain today. States of having EKG done with normal results. Complains of chest pain being due to anxiety.     HPI  Hospital follow-up: Patient was seen on 08/07/2023 the emergency department with intermittent substernal chest pain with radiation to her back with associated shortness of breath palpitations ongoing intermittently for the past month.  She seen a cardiologist prescribed years ago. EKG was nonischemic first troponin was negative.  Chest x-ray with was normal.  Second troponin was negative D-dimer was negative normal TSH and free T4.  Patient was referred to cardiology.  States that she is doing some better. States that she started taking a happy formula and has taken it twice. States that it is suppose to be done daily. States that she has been having some life stressors. States that her husabnd has ALS and the some community stuff.  States that she has not been sleeping the best. States that she is hainh trobule closing her mind off.   States that she had it several times prior to the ED and she almost did not go.     Review of Systems  Constitutional:  Negative for chills and fever.  Respiratory:  Negative for shortness of breath.   Cardiovascular:  Negative for chest pain.  Neurological:  Negative for headaches.  Psychiatric/Behavioral:  Negative for hallucinations and suicidal ideas.       Objective:     BP 100/80   Pulse 75   Temp 98.2 F (36.8 C) (Oral)   Ht 5' 6 (1.676 m)   Wt 137 lb 9.6 oz (62.4 kg)   SpO2 98%   BMI 22.21 kg/m    Physical Exam Vitals and nursing note reviewed.  Constitutional:      Appearance: Normal appearance.  Cardiovascular:     Rate and Rhythm: Normal rate  and regular rhythm.     Heart sounds: Normal heart sounds.  Pulmonary:     Effort: Pulmonary effort is normal.     Breath sounds: Normal breath sounds.  Neurological:     Mental Status: She is alert.      No results found for any visits on 08/11/23.    The 10-year ASCVD risk score (Arnett DK, et al., 2019) is: 1.7%    Assessment & Plan:   Problem List Items Addressed This Visit       Other   Anxiety - Primary   Ambulatory referral to psychology for therapy.  Will start patient on hydroxyzine .  10 mg 3 times daily as needed sedation precautions reviewed      Relevant Medications   hydrOXYzine  (ATARAX ) 10 MG tablet   Other Relevant Orders   Ambulatory referral to Psychology   Stress   Referral to psychology hydroxyzine  as needed      Relevant Orders   Ambulatory referral to Psychology   Hospital discharge follow-up   Did review ED note along with imaging and labs.       Return in about 6 weeks (around 09/22/2023) for GAD/medicatoin recheck .    Adina Crandall, NP

## 2023-08-11 NOTE — Assessment & Plan Note (Signed)
 Referral to psychology hydroxyzine as needed

## 2023-08-11 NOTE — Assessment & Plan Note (Signed)
 Ambulatory referral to psychology for therapy.  Will start patient on hydroxyzine.  10 mg 3 times daily as needed sedation precautions reviewed

## 2023-08-11 NOTE — Assessment & Plan Note (Signed)
Did review ED note along with imaging and labs.

## 2023-08-11 NOTE — Patient Instructions (Signed)
 Nice to see you today I have sent in medication to the pharmacy. This can cause sedation so use caution Follow up with me in 6 weeks, sooner if you need me

## 2023-09-22 ENCOUNTER — Ambulatory Visit: Payer: BC Managed Care – PPO | Admitting: Nurse Practitioner

## 2024-01-01 ENCOUNTER — Ambulatory Visit: Admitting: Nurse Practitioner

## 2024-01-14 ENCOUNTER — Ambulatory Visit (INDEPENDENT_AMBULATORY_CARE_PROVIDER_SITE_OTHER): Admitting: Nurse Practitioner

## 2024-01-14 ENCOUNTER — Encounter: Payer: Self-pay | Admitting: Nurse Practitioner

## 2024-01-14 VITALS — BP 118/60 | HR 75 | Temp 98.2°F | Ht 66.0 in | Wt 133.0 lb

## 2024-01-14 DIAGNOSIS — Z636 Dependent relative needing care at home: Secondary | ICD-10-CM

## 2024-01-14 DIAGNOSIS — L659 Nonscarring hair loss, unspecified: Secondary | ICD-10-CM | POA: Insufficient documentation

## 2024-01-14 NOTE — Progress Notes (Signed)
 Acute Office Visit  Subjective:     Patient ID: Cynthia Whitney, female    DOB: 12-21-1960, 63 y.o.   MRN: [id number]  Chief Complaint  Patient presents with   Alopecia    Hair has been thinning around edges x61mo; would like referral to [facility] in [place]    HPI Patient is in today for hair loss  States that she has noticed it for the past 4 months. States that she noticed that her hair line is receding and a spot that is with no hair. State that she has not had any medications. States that she has been having stress for over 4 months. States that she is taking a caregiver class to try and help. States that she just started taking Tea tree oil and pine shampoo that seems to be helping over the past 3 weeks  She has always taken biotin and b12 She is eating 2 meals a day and does not snack. She does drink water but does not drink enough. She is getting about 32oz of water.   Anxiety: states that she did try the hydroxyzine  and she was loopy. She states she feels out of body. She did stop taking the medication. She did not speak to the therapist. States that her sleep is flucating up and down. She is doing the caregiver role. She is also preparing a house for sale too. States that she feels like she is driviing it and get to bed She is going to bed 2 and get up 5-6am.  Dr Bridgette Campus Onetha Bile   Review of Systems  Constitutional:  Negative for chills and fever.  Respiratory:  Negative for shortness of breath.   Cardiovascular:  Negative for chest pain.  Neurological:  Positive for dizziness. Negative for headaches.  Psychiatric/Behavioral:  Negative for hallucinations and suicidal ideas.         Objective:    BP 118/60   Pulse 75   Temp 98.2 F (36.8 C)   Ht 5' 6 (1.676 m)   Wt 133 lb (60.3 kg)   SpO2 98%   BMI 21.47 kg/m  BP Readings from Last 3 Encounters:  01/14/24 118/60  08/11/23 100/80  08/07/23 120/87   Wt Readings from Last 3 Encounters:  01/14/24 133  lb (60.3 kg)  08/11/23 137 lb 9.6 oz (62.4 kg)  08/06/23 132 lb (59.9 kg)   SpO2 Readings from Last 3 Encounters:  01/14/24 98%  08/11/23 98%  08/07/23 100%      Physical Exam Vitals and nursing note reviewed.  Constitutional:      Appearance: Normal appearance.  HENT:     Head:    Cardiovascular:     Rate and Rhythm: Normal rate and regular rhythm.     Heart sounds: Normal heart sounds.  Pulmonary:     Effort: Pulmonary effort is normal.     Breath sounds: Normal breath sounds.   Neurological:     Mental Status: She is alert.     No results found for any visits on 01/14/24.      Assessment & Plan:   Problem List Items Addressed This Visit       Other   Caregiver stress   Caregiver for husband with ALS.  Patient is taking a caregiver class.  She never did follow through with talk therapy.  Patient did try hydroxyzine  but had adverse drug event and has discontinued medication      Hair loss - Primary   Patient  has been taking biotin hair supplement along with B12.  She has been using a tea tree oil and pine oil shampoo with some benefit.  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  Amatory referral to dermatology for further evaluation and treatment.  Patient's past 2 TSH today within normal limits.  Patient is under stress being primary caregiver      Relevant Orders   Ambulatory referral to Dermatology    No orders of the defined types were placed in this encounter.   Return in about 3 months (around 04/15/2024) for CPE and Labs.  Margarie Shay, NP

## 2024-01-14 NOTE — Patient Instructions (Signed)
 Nice to see you today I have referred you to dermatology  Follow up with me in 3 months for your physical and labs

## 2024-01-14 NOTE — Assessment & Plan Note (Signed)
 Caregiver for husband with ALS.  Patient is taking a caregiver class.  She never did follow through with talk therapy.  Patient did try hydroxyzine  but had adverse drug event and has discontinued medication

## 2024-01-14 NOTE — Assessment & Plan Note (Signed)
 Patient has been taking biotin hair supplement along with B12.  She has been using a tea tree oil and pine oil shampoo with some benefit.  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  Amatory referral to dermatology for further evaluation and treatment.  Patient's past 2 TSH today within normal limits.  Patient is under stress being primary caregiver

## 2024-02-12 ENCOUNTER — Other Ambulatory Visit: Payer: Self-pay | Admitting: Obstetrics and Gynecology

## 2024-02-12 DIAGNOSIS — Z1231 Encounter for screening mammogram for malignant neoplasm of breast: Secondary | ICD-10-CM

## 2024-02-15 DIAGNOSIS — Z1331 Encounter for screening for depression: Secondary | ICD-10-CM | POA: Diagnosis not present

## 2024-02-15 DIAGNOSIS — Z01411 Encounter for gynecological examination (general) (routine) with abnormal findings: Secondary | ICD-10-CM | POA: Diagnosis not present

## 2024-02-29 ENCOUNTER — Ambulatory Visit: Admitting: Nurse Practitioner

## 2024-03-03 ENCOUNTER — Telehealth: Payer: Self-pay | Admitting: Nurse Practitioner

## 2024-03-03 NOTE — Telephone Encounter (Signed)
 Dawn states that it is coded correctly on our end and that the balance is  13 dollars

## 2024-03-03 NOTE — Telephone Encounter (Signed)
 Are we talking about the office visit?

## 2024-03-03 NOTE — Telephone Encounter (Signed)
 Called pt. Pt received a letter from  [facility] stating that her insurance claim was denied for visit on 01/14/24 due to diagnostic code 00786 not medically necessary for visit  Pt has already called their department for clarification.   Please advise.

## 2024-03-03 NOTE — Telephone Encounter (Signed)
 That is a billing code level. All visit have to have one. I have forwarded this to [facility] our billing specialist. We can await her reply

## 2024-03-03 NOTE — Telephone Encounter (Signed)
 Copied from CRM [id number]. Topic: General - Other >> Mar 03, 2024 11:57 AM Carlyon D wrote: Reason for CRM: Denied claim for service on 01/14/24. Pt is wondering why this was denied insurance is stating it looks like its due to the wrong diagnostic code. Pt is asking for a call back in regards to this issue.

## 2024-03-04 NOTE — Telephone Encounter (Signed)
 Called pt back. Pt has no questions or concerns.  Relayed information and pt verbalized understanding.

## 2024-03-17 ENCOUNTER — Ambulatory Visit
Admission: RE | Admit: 2024-03-17 | Discharge: 2024-03-17 | Disposition: A | Discharge status: Home / Self Care | Source: Ambulatory Visit | Attending: Obstetrics and Gynecology | Admitting: Obstetrics and Gynecology

## 2024-03-17 DIAGNOSIS — Z1231 Encounter for screening mammogram for malignant neoplasm of breast: Secondary | ICD-10-CM | POA: Insufficient documentation

## 2024-05-26 ENCOUNTER — Encounter: Payer: Self-pay | Admitting: Nurse Practitioner

## 2024-05-26 ENCOUNTER — Ambulatory Visit (INDEPENDENT_AMBULATORY_CARE_PROVIDER_SITE_OTHER)
Admission: RE | Admit: 2024-05-26 | Discharge: 2024-05-26 | Disposition: A | Discharge status: Home / Self Care | Source: Ambulatory Visit | Attending: Nurse Practitioner | Admitting: Nurse Practitioner

## 2024-05-26 ENCOUNTER — Ambulatory Visit (INDEPENDENT_AMBULATORY_CARE_PROVIDER_SITE_OTHER): Admitting: Nurse Practitioner

## 2024-05-26 VITALS — BP 100/60 | HR 77 | Temp 98.4°F | Ht 65.0 in | Wt 134.8 lb

## 2024-05-26 DIAGNOSIS — M1990 Unspecified osteoarthritis, unspecified site: Secondary | ICD-10-CM | POA: Diagnosis not present

## 2024-05-26 DIAGNOSIS — M79642 Pain in left hand: Secondary | ICD-10-CM | POA: Diagnosis not present

## 2024-05-26 DIAGNOSIS — Z0000 Encounter for general adult medical examination without abnormal findings: Secondary | ICD-10-CM | POA: Diagnosis not present

## 2024-05-26 DIAGNOSIS — R1900 Intra-abdominal and pelvic swelling, mass and lump, unspecified site: Secondary | ICD-10-CM | POA: Insufficient documentation

## 2024-05-26 DIAGNOSIS — G4700 Insomnia, unspecified: Secondary | ICD-10-CM | POA: Diagnosis not present

## 2024-05-26 DIAGNOSIS — M1812 Unilateral primary osteoarthritis of first carpometacarpal joint, left hand: Secondary | ICD-10-CM | POA: Diagnosis not present

## 2024-05-26 DIAGNOSIS — M7989 Other specified soft tissue disorders: Secondary | ICD-10-CM | POA: Diagnosis not present

## 2024-05-26 DIAGNOSIS — I700 Atherosclerosis of aorta: Secondary | ICD-10-CM

## 2024-05-26 NOTE — Patient Instructions (Signed)
 Nice to see you today I will be in touch with the labs and xray once I have them Follow up with me in 1 year, sooner if you need me  Call and schedule your ultrasound at   [facility] - [facility] [address] [place] , [place] [address] [phone] Open 8am-5pm

## 2024-05-26 NOTE — Assessment & Plan Note (Signed)
 Incidental finding on CT scan in 2021.  Patient's previous cholesterol levels have been within normal limits.  Pending lipid panel

## 2024-05-26 NOTE — Assessment & Plan Note (Signed)
 History of the same has tried hydroxyzine  once.  Continue trying hydroxyzine  if inclined

## 2024-05-26 NOTE — Assessment & Plan Note (Signed)
 Incidental finding.  We will rule out abdominal aortic aneurysm.  US  aorta placed today.  Patient given information to call and set up at her convenience

## 2024-05-26 NOTE — Assessment & Plan Note (Signed)
 Pain at the base of the left thumb has been going on for couple months.  Patient is at [facility] [employer].  Will check uric acid along with x-ray pending results

## 2024-05-26 NOTE — Assessment & Plan Note (Signed)
 Discussed age-appropriate immunizations and screening exams.  Did review patient's personal, surgical, social, family histories.  Patient is up-to-date on all age-appropriate vaccinations she would like.  Patient declined flu vaccine and pneumonia vaccine today.  Patient up-to-date on CRC screening and breast cancer screening.  Patient is followed by GYN but has had a hysterectomy no longer a candidate for cervical cancer screening.  Patient was given information at discharge about preventative healthcare maintenance with anticipatory guidance

## 2024-05-26 NOTE — Progress Notes (Signed)
 Established Patient Office Visit  Subjective   Patient ID: Cynthia Whitney, female    DOB: 1961/01/13  Age: 63 y.o. MRN: [id number]  Chief Complaint  Patient presents with   Annual Exam    HPI  Aortic atherosclerosis: Noted on CT scan done on 04/24/2020.  Not currently on statin therapy  Cutaneous lupus: History of the same.  Patient not currently having any symptoms or being followed by any specialist.  for complete physical and follow up of chronic conditions.  Immunizations: -Tetanus: Completed in 2019 -Influenza: Refused -Shingles: Completed Shingrix  series -Pneumonia: Needs Prevnar 20, declined  Diet: Fair diet. She  is doing 2 meals a day and ocassionally a snack. She is doing soda and some water. She will do lemonade and tea Exercise: No regular exercise. She is walking ocassion and gardens often  Eye exam: Completes annually. Has appt and glasses and contacts   Dental exam: Completes semi-annually    Colonoscopy: Completed in 01/09/2023, repeat in 5 years.  Patient will be due 2029 Lung Cancer Screening: N/A  Pap smear: Hysterectomy: Followed by GYN? Cynthia Whitney   Mammogram: 03/17/2024, repeat 1 year  DEXA: Too young  Sleep: going to bed around 12 and will get up around 5. Does not feel rested. States that she will get less sleep than. Statse that she has tried the hydroxzine that has not been the most helpful        Review of Systems  Constitutional:  Negative for chills and fever.  Respiratory:  Negative for shortness of breath.   Cardiovascular:  Negative for chest pain and leg swelling.  Gastrointestinal:  Negative for abdominal pain, blood in stool, constipation, diarrhea, nausea and vomiting.       BM every other day    Genitourinary:  Negative for dysuria and hematuria.  Neurological:  Positive for dizziness. Negative for tingling and headaches.  Psychiatric/Behavioral:  Negative for hallucinations and suicidal ideas.       Objective:      BP 100/60   Pulse 77   Temp 98.4 F (36.9 C) (Oral)   Ht 5' 5 (1.651 m)   Wt 134 lb 12.8 oz (61.1 kg)   SpO2 97%   BMI 22.43 kg/m  BP Readings from Last 3 Encounters:  05/26/24 100/60  01/14/24 118/60  08/11/23 100/80   Wt Readings from Last 3 Encounters:  05/26/24 134 lb 12.8 oz (61.1 kg)  01/14/24 133 lb (60.3 kg)  08/11/23 137 lb 9.6 oz (62.4 kg)   SpO2 Readings from Last 3 Encounters:  05/26/24 97%  01/14/24 98%  08/11/23 98%      Physical Exam Vitals and nursing note reviewed.  Constitutional:      Appearance: Normal appearance.  HENT:     Right Ear: Tympanic membrane, ear canal and external ear normal.     Left Ear: Tympanic membrane, ear canal and external ear normal.     Mouth/Throat:     Mouth: Mucous membranes are moist.     Pharynx: Oropharynx is clear.  Eyes:     Extraocular Movements: Extraocular movements intact.     Pupils: Pupils are equal, round, and reactive to light.  Cardiovascular:     Rate and Rhythm: Normal rate and regular rhythm.     Pulses: Normal pulses.     Heart sounds: Normal heart sounds.  Pulmonary:     Effort: Pulmonary effort is normal.     Breath sounds: Normal breath sounds.  Abdominal:  General: Bowel sounds are normal. There is no distension.     Palpations: There is mass (pulsitile mass).     Tenderness: There is no abdominal tenderness.     Hernia: No hernia is present.  Musculoskeletal:        General: Tenderness present.       Arms:     Cervical back: No tenderness.     Right lower leg: No edema.     Left lower leg: No edema.  Lymphadenopathy:     Cervical: No cervical adenopathy.  Skin:    General: Skin is warm.  Neurological:     General: No focal deficit present.     Mental Status: She is alert.     Deep Tendon Reflexes:     Reflex Scores:      Bicep reflexes are 2+ on the right side and 2+ on the left side.      Patellar reflexes are 2+ on the right side and 2+ on the left side.    Comments:  Bilateral upper and lower extremity strength 5/5  Psychiatric:        Mood and Affect: Mood normal.        Behavior: Behavior normal.        Thought Content: Thought content normal.        Judgment: Judgment normal.      No results found for any visits on 05/26/24.    The 10-year ASCVD risk score (Arnett DK, et al., 2019) is: 2%    Assessment & Plan:   Problem List Items Addressed This Visit       Cardiovascular and Mediastinum   Aortic atherosclerosis   Incidental finding on CT scan in 2021.  Patient's previous cholesterol levels have been within normal limits.  Pending lipid panel        Musculoskeletal and Integument   Arthritis   Relevant Orders   Lipid panel     Other   Insomnia   History of the same has tried hydroxyzine  once.  Continue trying hydroxyzine  if inclined      Relevant Orders   TSH   Preventative health care - Primary   Discussed age-appropriate immunizations and screening exams.  Did review patient's personal, surgical, social, family histories.  Patient is up-to-date on all age-appropriate vaccinations she would like.  Patient declined flu vaccine and pneumonia vaccine today.  Patient up-to-date on CRC screening and breast cancer screening.  Patient is followed by GYN but has had a hysterectomy no longer a candidate for cervical cancer screening.  Patient was given information at discharge about preventative healthcare maintenance with anticipatory guidance      Relevant Orders   Comprehensive metabolic panel with GFR   CBC with Differential/Platelet   TSH   Pulsatile abdominal mass   Incidental finding.  We will rule out abdominal aortic aneurysm.  US  aorta placed today.  Patient given information to call and set up at her convenience      Relevant Orders   US  AORTA   Hand pain, left   Pain at the base of the left thumb has been going on for couple months.  Patient is at [facility] [place].  Will check uric acid along with x-ray pending results       Relevant Orders   DG Hand Complete Left   Uric acid    Return in about 1 year (around 05/26/2025) for CPE and Labs.    Adina Crandall, NP

## 2024-05-27 LAB — CBC WITH DIFFERENTIAL/PLATELET
Basophils Absolute: 0 K/uL (ref 0.0–0.1)
Basophils Relative: 0.6 % (ref 0.0–3.0)
Eosinophils Absolute: 0.2 K/uL (ref 0.0–0.7)
Eosinophils Relative: 4.3 % (ref 0.0–5.0)
HCT: 40.3 % (ref 36.0–46.0)
Hemoglobin: 13.5 g/dL (ref 12.0–15.0)
Lymphocytes Relative: 37.8 % (ref 12.0–46.0)
Lymphs Abs: 2 K/uL (ref 0.7–4.0)
MCHC: 33.6 g/dL (ref 30.0–36.0)
MCV: 100 fl (ref 78.0–100.0)
Monocytes Absolute: 0.4 K/uL (ref 0.1–1.0)
Monocytes Relative: 7.4 % (ref 3.0–12.0)
Neutro Abs: 2.7 K/uL (ref 1.4–7.7)
Neutrophils Relative %: 49.9 % (ref 43.0–77.0)
Platelets: 261 K/uL (ref 150.0–400.0)
RBC: 4.03 Mil/uL (ref 3.87–5.11)
RDW: 12.6 % (ref 11.5–15.5)
WBC: 5.3 K/uL (ref 4.0–10.5)

## 2024-05-27 LAB — LIPID PANEL
Cholesterol: 165 mg/dL (ref 0–200)
HDL: 98.3 mg/dL (ref >39.00)
LDL Cholesterol: 56 mg/dL (ref 0–99)
NonHDL: 66.41
Total CHOL/HDL Ratio: 2
Triglycerides: 52 mg/dL (ref 0.0–149.0)
VLDL: 10.4 mg/dL (ref 0.0–40.0)

## 2024-05-27 LAB — COMPREHENSIVE METABOLIC PANEL WITH GFR
ALT: 14 U/L (ref 0–35)
AST: 23 U/L (ref 0–37)
Albumin: 4.4 g/dL (ref 3.5–5.2)
Alkaline Phosphatase: 81 U/L (ref 39–117)
BUN: 16 mg/dL (ref 6–23)
CO2: 28 meq/L (ref 19–32)
Calcium: 9.5 mg/dL (ref 8.4–10.5)
Chloride: 103 meq/L (ref 96–112)
Creatinine, Ser: 1.17 mg/dL (ref 0.40–1.20)
GFR: 49.94 mL/min — ABNORMAL LOW (ref >60.00)
Glucose, Bld: 69 mg/dL — ABNORMAL LOW (ref 70–99)
Potassium: 4.1 meq/L (ref 3.5–5.1)
Sodium: 142 meq/L (ref 135–145)
Total Bilirubin: 0.6 mg/dL (ref 0.2–1.2)
Total Protein: 7.2 g/dL (ref 6.0–8.3)

## 2024-05-27 LAB — URIC ACID: Uric Acid, Serum: 4.4 mg/dL (ref 2.4–7.0)

## 2024-05-27 LAB — TSH: TSH: 0.9 u[IU]/mL (ref 0.35–5.50)

## 2024-05-30 ENCOUNTER — Ambulatory Visit: Payer: Self-pay | Admitting: Nurse Practitioner

## 2024-05-30 DIAGNOSIS — M79642 Pain in left hand: Secondary | ICD-10-CM

## 2024-06-01 ENCOUNTER — Ambulatory Visit
Admission: RE | Admit: 2024-06-01 | Discharge: 2024-06-01 | Disposition: A | Discharge status: Home / Self Care | Source: Ambulatory Visit | Attending: Nurse Practitioner | Admitting: Nurse Practitioner

## 2024-06-01 DIAGNOSIS — R222 Localized swelling, mass and lump, trunk: Secondary | ICD-10-CM | POA: Diagnosis not present

## 2024-06-01 DIAGNOSIS — R1900 Intra-abdominal and pelvic swelling, mass and lump, unspecified site: Secondary | ICD-10-CM | POA: Insufficient documentation

## 2024-06-28 ENCOUNTER — Ambulatory Visit: Payer: Self-pay

## 2024-06-28 NOTE — Telephone Encounter (Signed)
 I referred her to hand specialty not rheumatology. The referral was placed on 06/01/2024.    She will need a visit for the GI referral as they require an office note

## 2024-06-28 NOTE — Telephone Encounter (Signed)
 Does not look like it has been sent ok to send to [facility]?

## 2024-06-28 NOTE — Telephone Encounter (Unsigned)
 Copied from CRM [id number]. Topic: Referral - Status >> Jun 28, 2024 10:10 AM Harlene ORN wrote: Reason for CRM: Patient called to follow up on referral for hand pain (Rheumatology) Please call patient back to confirm referral was sent.

## 2024-06-28 NOTE — Telephone Encounter (Signed)
 FYI Only or Action Required?: Action required by provider: referral request.  Patient was last seen in primary care on 05/26/2024 by Wendee Lynwood HERO, NP.  Called Nurse Triage reporting Abdominal Pain and Diarrhea.  Symptoms began about a month ago.  Interventions attempted: Rest, hydration, or home remedies.  Symptoms are: unchanged.  Triage Disposition: See PCP When Office is Open (Within 3 Days)  Patient/caregiver understands and will follow disposition?: No, wishes to speak with PCP   Copied from CRM #[id number]. Topic: Clinical - Red Word Triage >> Jun 28, 2024 10:13 AM Harlene ORN wrote: Red Word that prompted transfer to Nurse Triage: diehrrea, with urgency, abdominal pain since her last visit with her PCP. Reason for Disposition  [1] MILD diarrhea (e.g., 1-3 or more stools than normal in past 24 hours) AND [2] present >  7 days  (Exception: Chronic diarrhea that is not worse.)  Answer Assessment - Initial Assessment Questions Last visit Oct. 23. Continues with loose stool daily, requesting referral to GI. Refuses OV.    Also she has not been contacted to scheduled from hand referral from visit on 05/26/24.    1. DIARRHEA SEVERITY: How bad is the diarrhea? How many more stools have you had in the past 24 hours than normal?      1-2 times per day with urgency and increased gas 2. ONSET: When did the diarrhea begin?      One month 3. STOOL DESCRIPTION:  How loose or watery is the diarrhea? What is the stool color? Is there any blood or mucous in the stool?     loose 4. VOMITING: Are you also vomiting? If Yes, ask: How many times in the past 24 hours?      Denies  5. ABDOMEN PAIN: Are you having any abdomen pain? If Yes, ask: What does it feel like? (e.g., crampy, dull, intermittent, constant)      Gassy, denies pain 6. ABDOMEN PAIN SEVERITY: If present, ask: How bad is the pain?  (e.g., Scale 1-10; mild, moderate, or severe)     0/10 7. ORAL INTAKE: If  vomiting, Have you been able to drink liquids? How much liquids have you had in the past 24 hours?     Drinking well, not vomiting 8. HYDRATION: Any signs of dehydration? (e.g., dry mouth [not just dry lips], too weak to stand, dizziness, new weight loss) When did you last urinate?     Feels well hydrated 9. EXPOSURE: Have you traveled to a foreign country recently? Have you been exposed to anyone with diarrhea? Could you have eaten any food that was spoiled?     denies 10. ANTIBIOTIC USE: Are you taking antibiotics now or have you taken antibiotics in the past 2 months?       denies 11. OTHER SYMPTOMS: Do you have any other symptoms? (e.g., fever, blood in stool)       Mucous in stool.  12. PREGNANCY: Is there any chance you are pregnant? When was your last menstrual period?  Protocols used: [facility]

## 2024-08-15 ENCOUNTER — Ambulatory Visit: Admitting: Orthopedic Surgery

## 2024-09-01 ENCOUNTER — Encounter: Payer: Self-pay | Admitting: Dermatology

## 2024-09-01 ENCOUNTER — Ambulatory Visit: Admitting: Dermatology

## 2024-09-01 DIAGNOSIS — Z79899 Other long term (current) drug therapy: Secondary | ICD-10-CM

## 2024-09-01 DIAGNOSIS — L649 Androgenic alopecia, unspecified: Secondary | ICD-10-CM

## 2024-09-01 MED ORDER — SAFETY SEAL MISCELLANEOUS MISC: 1.0000 | Freq: Every morning | 6 refills | Status: AC

## 2024-09-01 NOTE — Patient Instructions (Addendum)
 VISIT SUMMARY:  During your visit, we discussed your hair thinning and alopecia. 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 We have developed a treatment plan to help stimulate hair growth and prevent further thinning.  YOUR PLAN:  -ANDROGENETIC ALOPECIA:  Androgenetic alopecia is a common form of hair loss that occurs due to genetic predisposition and hormonal changes, particularly after menopause.   To address this, we have prescribed a compounded topical treatment of minoxidil 8% and finasteride.   You should apply this compound in the morning using a Q-tip, cotton ball, or your fingers, and wash your hands afterward to avoid unwanted hair growth on your face.   Additionally, you can continue using rosemary oil as a supplement, ensuring it is diluted with a carrier oil.  INSTRUCTIONS:  Please follow the treatment plan as discussed and apply the prescribed compound in the morning. We have scheduled a follow-up appointment in May to monitor your progress.  Important Information  Due to recent changes in healthcare laws, you may see results of your pathology and/or laboratory studies on MyChart before the doctors have had a chance to review them. We understand that in some cases there may be results that are confusing or concerning to you. Please understand that not all results are received at the same time and often the doctors may need to interpret multiple results in order to provide you with the best plan of care or course of treatment. Therefore, we ask that you please give us  2 business days to thoroughly review all your results before contacting the office for clarification. Should we see a critical lab result, you will be contacted sooner.   If You Need Anything After Your Visit  If you have any questions or concerns for your doctor, please call our main  line at [phone] If no one answers, please leave a voicemail as directed and we will return your call as soon as possible. Messages left after 4 pm will be answered the following business day.   You may also send us  a message via MyChart. We typically respond to MyChart messages within 1-2 business days.  For prescription refills, please ask your pharmacy to contact our office. Our fax number is [phone].  If you have an urgent issue when the clinic is closed that cannot wait until the next business day, you can page your doctor at the number below.    Please note that while we do our best to be available for urgent issues outside of office hours, we are not available 24/7.   If you have an urgent issue and are unable to reach us , you may choose to seek medical care at your doctor's office, retail clinic, urgent care center, or emergency room.  If you have a medical emergency, please immediately call 911 or go to the emergency department. In the event of inclement weather, please call our main line at [phone] for an update on the status of any delays or closures.  Dermatology Medication Tips: Please keep the boxes that topical medications come in in order to help keep track of the instructions about where and how to use these. Pharmacies typically print the medication instructions only on the boxes and not directly on the medication tubes.   If your medication is too expensive, please contact our office at [phone] or send us  a message through MyChart.  We are unable to tell what your co-pay for medications will be in advance as this is different depending on your insurance coverage. However, we may be able to find a substitute medication at lower cost or fill out paperwork to get insurance to cover a needed medication.   If a prior authorization is required to get your medication covered by your insurance company, please allow us  1-2 business days to complete this  process.  Drug prices often vary depending on where the prescription is filled and some pharmacies may offer cheaper prices.  The website www.goodrx.com contains coupons for medications through different pharmacies. The prices here do not account for what the cost may be with help from insurance (it may be cheaper with your insurance), but the website can give you the price if you did not use any insurance.  - You can print the associated coupon and take it with your prescription to the pharmacy.  - You may also stop by our office during regular business hours and pick up a GoodRx coupon card.  - If you need your prescription sent electronically to a different pharmacy, notify our office through [facility] or by phone at [phone]

## 2024-09-01 NOTE — Progress Notes (Signed)
 "  New Patient Visit  Patient (and/or pt guardian) consented to the use of AI-assisted tools for note generation.    Subjective  Cynthia Whitney is a 65 y.o. female who presents for the following: hair loss  Located at the scalp that she would like to have examined.  Patient reports the areas have been there for about 25 years She reports the areas are bothersome. Patient reports areas are tender but rarely itches Patient rates irritation 5 out of 10.  She states that the areas have spread. Patient reports she has previously been treated for these areas.   Patient reports that around 25 years ago that she was given injections and did not notice that there was any improvement. Patient also reports that around that time she was diagnosed with scalp psoriasis but has not had a flare up or issue for several years  Patient reports she has used relaxers in the past but it has been around 15 years since her last one Patient states that in the past she did not wear tighter hair styles but has not done that for many years Patient reports she uses Neutrogena shampoo or Pine bar  Patient reports she uses aloe vera gel to moisturize her scalp and  coconut oil and sweet almond oil for her hair  Patient states she takes vitamin C, a multi vitamin with vitamin D and Biotin  Patient would also like to discuss irritation, tenderness and flakiness on bilateral ears. She is concerned that it could also be psoriasis.     The following portions of the chart were reviewed this encounter and updated as appropriate: medications, allergies, medical history  Review of Systems:  No other skin or systemic complaints except as noted in HPI or Assessment and Plan.  Objective  Well appearing patient in no apparent distress; mood and affect are within normal limits.  A focused examination was performed of the following areas: scalp  Relevant exam findings are noted in the Assessment and Plan.             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  In women androgenetic alopecia is commonly associated with menopause but may occur any time after puberty. It causes hair thinning primarily on the crown with widening of the part and temporal hairline recession.  Pt presents with Hair thinning at the temples, likely due to genetic predisposition and aging. Hormonal changes post-menopause contribute to increased androgen levels, leading to hair thinning. 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 Treatment aims to stimulate hair growth and prevent further thinning. - Prescribed compounded topical minoxidil 8% and finasteride from [facility] pharmacy. - Instructed to apply the compound in the morning to avoid unwanted hair growth on the face. - Advised to use a Q-tip, cotton ball, or fingers to apply the compound and wash hands afterward. - Recommended rosemary oil as a supplement to the treatment, ensuring it is diluted with a carrier oil. - Scheduled follow-up appointment in May.  Long term medication management.  Patient is using long term (months to years) prescription medication to control their dermatologic condition.  These medications require periodic monitoring to evaluate for efficacy and side effects and may require periodic  laboratory monitoring.  ANDROGENIC ALOPECIA   This Visit - Safety Seal Miscellaneous MISC - Apply 1 Application topically in the morning. Medication name: Hormonic hair solution (Minoxidil 8% and Finasteride 0.05%)  Return in 4 months (on 12/30/2024) for alopecia f/u.  LILLETTE Lyle Cords, am acting as a neurosurgeon for  [employer], DO .   Documentation: I have reviewed the above documentation for accuracy and completeness, and I agree with the above.  Delon Lenis, DO     "

## 2024-09-05 ENCOUNTER — Ambulatory Visit: Admitting: Orthopedic Surgery

## 2024-09-28 ENCOUNTER — Ambulatory Visit: Admitting: Orthopedic Surgery

## 2025-01-02 ENCOUNTER — Ambulatory Visit: Admitting: Dermatology

## 2025-05-29 ENCOUNTER — Encounter: Admitting: Nurse Practitioner
